# Patient Record
Sex: Female | Born: 1935 | Race: Black or African American | Hispanic: No | Smoking: Never smoker
Health system: Southern US, Community
[De-identification: ages and names within clinical notes are randomized; demographics above are authoritative.]

## PROBLEM LIST (undated history)

## (undated) DIAGNOSIS — K219 Gastro-esophageal reflux disease without esophagitis: Secondary | ICD-10-CM

## (undated) DIAGNOSIS — N189 Chronic kidney disease, unspecified: Secondary | ICD-10-CM

## (undated) DIAGNOSIS — R0602 Shortness of breath: Secondary | ICD-10-CM

## (undated) DIAGNOSIS — Z7901 Long term (current) use of anticoagulants: Secondary | ICD-10-CM

## (undated) DIAGNOSIS — I499 Cardiac arrhythmia, unspecified: Secondary | ICD-10-CM

## (undated) DIAGNOSIS — M1990 Unspecified osteoarthritis, unspecified site: Secondary | ICD-10-CM

## (undated) DIAGNOSIS — I447 Left bundle-branch block, unspecified: Secondary | ICD-10-CM

## (undated) DIAGNOSIS — J40 Bronchitis, not specified as acute or chronic: Secondary | ICD-10-CM

## (undated) DIAGNOSIS — R011 Cardiac murmur, unspecified: Secondary | ICD-10-CM

## (undated) DIAGNOSIS — Z860100 Personal history of colon polyps, unspecified: Secondary | ICD-10-CM

## (undated) DIAGNOSIS — I6521 Occlusion and stenosis of right carotid artery: Secondary | ICD-10-CM

## (undated) DIAGNOSIS — I10 Essential (primary) hypertension: Secondary | ICD-10-CM

## (undated) DIAGNOSIS — E785 Hyperlipidemia, unspecified: Secondary | ICD-10-CM

## (undated) DIAGNOSIS — R600 Localized edema: Secondary | ICD-10-CM

## (undated) DIAGNOSIS — I2510 Atherosclerotic heart disease of native coronary artery without angina pectoris: Secondary | ICD-10-CM

## (undated) HISTORY — DX: Localized edema: R60.0

## (undated) HISTORY — DX: Chronic kidney disease, unspecified: N18.9

## (undated) HISTORY — PX: ABDOMINAL HYSTERECTOMY: SHX81

## (undated) HISTORY — DX: Long term (current) use of anticoagulants: Z79.01

## (undated) HISTORY — DX: Left bundle-branch block, unspecified: I44.7

## (undated) HISTORY — PX: APPENDECTOMY: SHX54

## (undated) HISTORY — PX: CHOLECYSTECTOMY: SHX55

## (undated) HISTORY — DX: Occlusion and stenosis of right carotid artery: I65.21

## (undated) HISTORY — PX: CARPAL TUNNEL RELEASE: SHX101

---

## 2009-08-28 ENCOUNTER — Encounter: Admission: RE | Admit: 2009-08-28 | Discharge: 2009-08-28 | Payer: Self-pay | Admitting: Internal Medicine

## 2009-08-31 ENCOUNTER — Encounter: Admission: RE | Admit: 2009-08-31 | Discharge: 2009-08-31 | Payer: Self-pay | Admitting: Internal Medicine

## 2010-10-03 ENCOUNTER — Encounter
Admission: RE | Admit: 2010-10-03 | Discharge: 2010-10-03 | Payer: Self-pay | Source: Home / Self Care | Attending: Internal Medicine | Admitting: Internal Medicine

## 2011-03-10 ENCOUNTER — Other Ambulatory Visit: Payer: Self-pay | Admitting: Internal Medicine

## 2011-03-10 DIAGNOSIS — R110 Nausea: Secondary | ICD-10-CM

## 2011-03-10 DIAGNOSIS — K219 Gastro-esophageal reflux disease without esophagitis: Secondary | ICD-10-CM

## 2011-03-12 ENCOUNTER — Ambulatory Visit
Admission: RE | Admit: 2011-03-12 | Discharge: 2011-03-12 | Disposition: A | Discharge status: Home / Self Care | Payer: Medicare Other | Source: Ambulatory Visit | Attending: Internal Medicine | Admitting: Internal Medicine

## 2011-03-12 DIAGNOSIS — R110 Nausea: Secondary | ICD-10-CM

## 2011-03-12 DIAGNOSIS — K219 Gastro-esophageal reflux disease without esophagitis: Secondary | ICD-10-CM

## 2011-09-08 ENCOUNTER — Other Ambulatory Visit: Payer: Self-pay | Admitting: Internal Medicine

## 2011-09-08 DIAGNOSIS — Z1231 Encounter for screening mammogram for malignant neoplasm of breast: Secondary | ICD-10-CM

## 2011-10-09 ENCOUNTER — Ambulatory Visit
Admission: RE | Admit: 2011-10-09 | Discharge: 2011-10-09 | Disposition: A | Discharge status: Home / Self Care | Payer: Medicare Other | Source: Ambulatory Visit | Attending: Internal Medicine | Admitting: Internal Medicine

## 2011-10-09 DIAGNOSIS — Z1231 Encounter for screening mammogram for malignant neoplasm of breast: Secondary | ICD-10-CM

## 2012-09-06 ENCOUNTER — Other Ambulatory Visit: Payer: Self-pay | Admitting: Internal Medicine

## 2012-09-06 DIAGNOSIS — Z1231 Encounter for screening mammogram for malignant neoplasm of breast: Secondary | ICD-10-CM

## 2012-10-05 ENCOUNTER — Encounter (HOSPITAL_COMMUNITY): Payer: Self-pay | Admitting: Certified Registered"

## 2012-10-05 ENCOUNTER — Encounter (HOSPITAL_COMMUNITY): Payer: Self-pay | Admitting: *Deleted

## 2012-10-05 ENCOUNTER — Encounter (HOSPITAL_COMMUNITY)
Admission: RE | Disposition: A | Discharge status: Home / Self Care | Payer: Self-pay | Source: Ambulatory Visit | Attending: Cardiology

## 2012-10-05 ENCOUNTER — Ambulatory Visit (HOSPITAL_COMMUNITY)
Admission: RE | Admit: 2012-10-05 | Discharge: 2012-10-05 | Disposition: A | Discharge status: Home / Self Care | Payer: Medicare Other | Source: Ambulatory Visit | Attending: Cardiology | Admitting: Cardiology

## 2012-10-05 ENCOUNTER — Ambulatory Visit (HOSPITAL_COMMUNITY): Payer: Medicare Other | Admitting: Certified Registered"

## 2012-10-05 DIAGNOSIS — I4891 Unspecified atrial fibrillation: Secondary | ICD-10-CM | POA: Insufficient documentation

## 2012-10-05 DIAGNOSIS — I10 Essential (primary) hypertension: Secondary | ICD-10-CM | POA: Insufficient documentation

## 2012-10-05 HISTORY — DX: Hyperlipidemia, unspecified: E78.5

## 2012-10-05 HISTORY — DX: Gastro-esophageal reflux disease without esophagitis: K21.9

## 2012-10-05 HISTORY — PX: CARDIOVERSION: SHX1299

## 2012-10-05 HISTORY — DX: Atherosclerotic heart disease of native coronary artery without angina pectoris: I25.10

## 2012-10-05 HISTORY — DX: Personal history of colon polyps, unspecified: Z86.0100

## 2012-10-05 HISTORY — DX: Unspecified osteoarthritis, unspecified site: M19.90

## 2012-10-05 HISTORY — DX: Cardiac arrhythmia, unspecified: I49.9

## 2012-10-05 HISTORY — DX: Essential (primary) hypertension: I10

## 2012-10-05 HISTORY — DX: Shortness of breath: R06.02

## 2012-10-05 HISTORY — DX: Personal history of colonic polyps: Z86.010

## 2012-10-05 SURGERY — CARDIOVERSION
Anesthesia: Monitor Anesthesia Care

## 2012-10-05 MED ORDER — PROPOFOL 10 MG/ML IV BOLUS
INTRAVENOUS | Status: DC | PRN
  Administered 2012-10-05: 70 mg via INTRAVENOUS

## 2012-10-05 MED ORDER — SODIUM CHLORIDE 0.9 % IV SOLN
INTRAVENOUS | Status: DC
  Administered 2012-10-05: 500 mL via INTRAVENOUS

## 2012-10-05 MED ORDER — SODIUM CHLORIDE 0.9 % IV SOLN
INTRAVENOUS | Status: DC | PRN
  Administered 2012-10-05: 14:00:00 via INTRAVENOUS

## 2012-10-05 NOTE — Transfer of Care (Signed)
Immediate Anesthesia Transfer of Care Note  Patient: Kayla Austin  Procedure(s) Performed: Procedure(s) (LRB) with comments: CARDIOVERSION (N/A)  Patient Location: Endoscopy Unit  Anesthesia Type:MAC  Level of Consciousness: awake, alert  and oriented  Airway & Oxygen Therapy: Patient Spontanous Breathing  Post-op Assessment: Report given to PACU RN  Post vital signs: Reviewed and stable  Complications: No apparent anesthesia complications

## 2012-10-05 NOTE — CV Procedure (Signed)
Direct current cardioversion:  Indication symptomatic A. Fibrillation.  Procedure: Using 70 mg of IV Propofol for achieving deep (Moderate sedation), synchronized direct current cardioversion performed. Patient was delivered with 120 Joules of electricity X 1 with success to NSR. Patient tolerated the procedure well. No immediate complication noted.

## 2012-10-05 NOTE — Anesthesia Preprocedure Evaluation (Addendum)
Anesthesia Evaluation  Patient identified by MRN, date of birth, ID band Patient awake    Reviewed: Allergy & Precautions, H&P , NPO status , reviewed documented beta blocker date and time   Airway Mallampati: I  Neck ROM: Full    Dental  (+) Edentulous Upper and Dental Advisory Given   Pulmonary shortness of breath and with exertion,  breath sounds clear to auscultation        Cardiovascular hypertension, Pt. on medications and Pt. on home beta blockers + CAD + dysrhythmias Atrial Fibrillation Rhythm:Irregular     Neuro/Psych    GI/Hepatic GERD-  Medicated and Controlled,  Endo/Other    Renal/GU      Musculoskeletal   Abdominal   Peds  Hematology   Anesthesia Other Findings   Reproductive/Obstetrics                           Anesthesia Physical Anesthesia Plan  ASA: III  Anesthesia Plan: MAC   Post-op Pain Management:    Induction: Intravenous  Airway Management Planned: Mask  Additional Equipment:   Intra-op Plan:   Post-operative Plan:   Informed Consent:   Dental advisory given  Plan Discussed with: CRNA, Anesthesiologist and Surgeon  Anesthesia Plan Comments:         Anesthesia Quick Evaluation

## 2012-10-05 NOTE — Anesthesia Postprocedure Evaluation (Signed)
  Anesthesia Post-op Note  Patient: Kayla Austin  Procedure(s) Performed: Procedure(s) (LRB) with comments: CARDIOVERSION (N/A)  Patient Location: Endoscopy Unit  Anesthesia Type:MAC  Level of Consciousness: awake, alert  and oriented  Airway and Oxygen Therapy: Patient Spontanous Breathing  Post-op Pain: none  Post-op Assessment: Post-op Vital signs reviewed  Post-op Vital Signs: Reviewed and stable  Complications: No apparent anesthesia complications

## 2012-10-05 NOTE — H&P (Signed)
  Please see paper chart  

## 2012-10-05 NOTE — Preoperative (Signed)
Beta Blockers   Reason not to administer Beta Blockers:Not Applicable, last dose 10/05/12 at 09:30

## 2012-10-05 NOTE — Interval H&P Note (Signed)
History and Physical Interval Note:  10/05/2012 1:51 PM  Kayla Austin  has presented today for surgery, with the diagnosis of a-fib  The various methods of treatment have been discussed with the patient and family. After consideration of risks, benefits and other options for treatment, the patient has consented to  Procedure(s) (LRB) with comments: CARDIOVERSION (N/A) as a surgical intervention .  The patient's history has been reviewed, patient examined, no change in status, stable for surgery.  I have reviewed the patient's chart and labs.  Questions were answered to the patient's satisfaction.     Pamella Pert

## 2012-10-06 ENCOUNTER — Encounter (HOSPITAL_COMMUNITY): Payer: Self-pay | Admitting: Cardiology

## 2012-10-14 ENCOUNTER — Ambulatory Visit
Admission: RE | Admit: 2012-10-14 | Discharge: 2012-10-14 | Disposition: A | Discharge status: Home / Self Care | Payer: Medicare Other | Source: Ambulatory Visit | Attending: Internal Medicine | Admitting: Internal Medicine

## 2012-10-14 DIAGNOSIS — Z1231 Encounter for screening mammogram for malignant neoplasm of breast: Secondary | ICD-10-CM

## 2013-01-26 ENCOUNTER — Other Ambulatory Visit: Payer: Self-pay | Admitting: Gastroenterology

## 2013-01-26 DIAGNOSIS — R1032 Left lower quadrant pain: Secondary | ICD-10-CM

## 2013-01-27 ENCOUNTER — Ambulatory Visit
Admission: RE | Admit: 2013-01-27 | Discharge: 2013-01-27 | Disposition: A | Discharge status: Home / Self Care | Payer: Medicare Other | Source: Ambulatory Visit | Attending: Gastroenterology | Admitting: Gastroenterology

## 2013-01-27 DIAGNOSIS — R1032 Left lower quadrant pain: Secondary | ICD-10-CM

## 2013-01-27 MED ORDER — IOHEXOL 300 MG/ML  SOLN
100.0000 mL | Freq: Once | INTRAMUSCULAR | Status: AC | PRN
  Administered 2013-01-27: 100 mL via INTRAVENOUS

## 2013-05-26 ENCOUNTER — Ambulatory Visit: Payer: Self-pay | Admitting: Obstetrics

## 2013-07-26 ENCOUNTER — Ambulatory Visit (INDEPENDENT_AMBULATORY_CARE_PROVIDER_SITE_OTHER): Payer: Medicare Other | Admitting: Obstetrics

## 2013-07-26 ENCOUNTER — Encounter: Payer: Self-pay | Admitting: Obstetrics

## 2013-07-26 VITALS — BP 154/75 | HR 73 | Temp 97.7°F | Ht 63.0 in | Wt 185.0 lb

## 2013-07-26 DIAGNOSIS — L0233 Carbuncle of buttock: Secondary | ICD-10-CM

## 2013-07-26 DIAGNOSIS — L0232 Furuncle of buttock: Secondary | ICD-10-CM

## 2013-07-26 NOTE — Progress Notes (Signed)
Subjective:     Kayla Austin is a 79 y.o. female here for a routine exam.  Current complaints: Patient is in the office today for annual exam- patient states she was notified by a letter that she needed to have a gyn exam. Patient is not interested in HRT- does sweat a lot. Patient has a bump on her behind.  Personal health questionnaire reviewed: yes.   Gynecologic History No LMP recorded. Patient has had a hysterectomy. Contraception: none Last Pap: years. Results were: normal Last mammogram: 1 year. Results were: normal  Obstetric History OB History  No data available     The following portions of the patient's history were reviewed and updated as appropriate: allergies, current medications, past family history, past medical history, past social history, past surgical history and problem list.  Review of Systems Pertinent items are noted in HPI.    Objective:    General appearance: alert and no distress Breasts: normal appearance, no masses or tenderness Abdomen: normal findings: soft, non-tender Pelvic: external genitalia normal, no adnexal masses or tenderness, uterus surgically absent and vagina normal without discharge   Buttocks:  Small furuncle right inner buttocks.  Small sore at upper split between buttocks. Assessment:    Healthy female exam.    Plan:    Follow up in: 2 years.    Recommended Neosporin for sores on buttocks.

## 2013-09-28 ENCOUNTER — Other Ambulatory Visit: Payer: Self-pay

## 2013-09-28 DIAGNOSIS — Z1231 Encounter for screening mammogram for malignant neoplasm of breast: Secondary | ICD-10-CM

## 2013-10-05 ENCOUNTER — Encounter (HOSPITAL_COMMUNITY): Payer: Self-pay | Admitting: Emergency Medicine

## 2013-10-05 ENCOUNTER — Emergency Department (INDEPENDENT_AMBULATORY_CARE_PROVIDER_SITE_OTHER)
Admission: EM | Admit: 2013-10-05 | Discharge: 2013-10-05 | Disposition: A | Discharge status: Home / Self Care | Payer: Medicare Other | Source: Home / Self Care | Attending: Emergency Medicine | Admitting: Emergency Medicine

## 2013-10-05 DIAGNOSIS — L0231 Cutaneous abscess of buttock: Secondary | ICD-10-CM

## 2013-10-05 MED ORDER — MELOXICAM 7.5 MG PO TABS: 7.5000 mg | ORAL_TABLET | Freq: Every day | ORAL | Status: DC

## 2013-10-05 MED ORDER — DOXYCYCLINE HYCLATE 100 MG PO CAPS: 100.0000 mg | ORAL_CAPSULE | Freq: Two times a day (BID) | ORAL | Status: DC

## 2013-10-05 NOTE — ED Provider Notes (Signed)
CSN: [id number]     Arrival date & time 10/05/13  1210 History   First MD Initiated Contact with Patient 10/05/13 1321     Chief Complaint  Patient presents with  . Skin Problem   (Consider location/radiation/quality/duration/timing/severity/associated sxs/prior Treatment) Patient is a 79 y.o. female presenting with abscess.  Abscess Abscess location: +right buttock. Abscess quality: painful, redness and warmth   Red streaking: no   Duration:  4 days Progression:  Worsening Pain details:    Severity:  Moderate   Duration:  3 days Context: not diabetes and not immunosuppression   Associated symptoms: no fever   Risk factors: no family hx of MRSA, no hx of MRSA and no prior abscess     Past Medical History  Diagnosis Date  . Hypertension   . GERD (gastroesophageal reflux disease)   . Arthritis   . Coronary artery disease   . Hyperlipemia   . Shortness of breath     on exertion  . Dysrhythmia     a fib  . History of colon polyps    Past Surgical History  Procedure Laterality Date  . Abdominal hysterectomy    . Cholecystectomy    . Cardioversion  10/05/2012    Procedure: CARDIOVERSION;  Surgeon: Pamella Pert, MD;  Location: [facility] ENDOSCOPY;  Service: Cardiovascular;  Laterality: N/A;   Family History  Problem Relation Age of Onset  . Cancer Sister    History  Substance Use Topics  . Smoking status: Never Smoker   . Smokeless tobacco: Not on file  . Alcohol Use: Yes     Comment: occasional   OB History   Grav Para Term Preterm Abortions TAB SAB Ect Mult Living                 Review of Systems  Constitutional: Negative for fever.  All other systems reviewed and are negative.    Allergies  Ampicillin  Home Medications   Current Outpatient Rx  Name  Route  Sig  Dispense  Refill  . amLODipine (NORVASC) 10 MG tablet   Oral   Take 10 mg by mouth daily.         Marland Kitchen apixaban (ELIQUIS) 5 MG TABS tablet   Oral   Take 5 mg by mouth 2 (two) times  daily.         Marland Kitchen doxycycline (VIBRAMYCIN) 100 MG capsule   Oral   Take 1 capsule (100 mg total) by mouth 2 (two) times daily. X 7 days   14 capsule   0   . hydrochlorothiazide (HYDRODIURIL) 25 MG tablet   Oral   Take 25 mg by mouth daily.         . meloxicam (MOBIC) 7.5 MG tablet   Oral   Take 7.5 mg by mouth daily.         . meloxicam (MOBIC) 7.5 MG tablet   Oral   Take 1 tablet (7.5 mg total) by mouth daily. As needed for shoulder pain   15 tablet   0   . metoprolol tartrate (LOPRESSOR) 25 MG tablet   Oral   Take 25 mg by mouth 2 (two) times daily.         . pravastatin (PRAVACHOL) 40 MG tablet   Oral   Take 40 mg by mouth daily.          BP 150/85  Pulse 73  Temp(Src) 97.3 F (36.3 C) (Oral)  Resp 18  SpO2 100% Physical Exam  Nursing note and vitals reviewed. Constitutional: She is oriented to person, place, and time. She appears well-developed and well-nourished. No distress.  +obese   HENT:  Head: Normocephalic and atraumatic.  Cardiovascular: Normal rate and regular rhythm.   Pulmonary/Chest: Effort normal and breath sounds normal.  Neurological: She is alert and oriented to person, place, and time.  Skin: Skin is warm and dry.  +3x3cm fluctuant abscess at right mediolateral buttock with moderate surrounding cellulitis  Psychiatric: She has a normal mood and affect. Her behavior is normal.    ED Course  INCISION AND DRAINAGE Date/Time: 10/06/2013 9:32 AM Performed by: Lemmie Evens LEE Authorized by: Lemmie Evens LEE Consent: Verbal consent obtained. Risks and benefits: risks, benefits and alternatives were discussed Consent given by: patient Patient understanding: patient states understanding of the procedure being performed Patient consent: the patient's understanding of the procedure matches consent given Procedure consent: procedure consent matches procedure scheduled Patient identity confirmed: verbally with patient Time  out: Immediately prior to procedure a "time out" was called to verify the correct patient, procedure, equipment, support staff and site/side marked as required. Type: abscess Location: +right buttock. Anesthesia: local infiltration Local anesthetic: lidocaine 2% without epinephrine Anesthetic total: 4 ml Patient sedated: no Scalpel size: 11 Incision type: single straight Complexity: simple Drainage: purulent Drainage amount: moderate Wound treatment: wound left open Patient tolerance: Patient tolerated the procedure well with no immediate complications.   (including critical care time) Labs Review Labs Reviewed - No data to display Imaging Review No results found.  EKG Interpretation    Date/Time:    Ventricular Rate:    PR Interval:    QRS Duration:   QT Interval:    QTC Calculation:   R Axis:     Text Interpretation:              MDM   1. Abscess of buttock, right    Reviewed wound care/aftercare instructions with patient prior to discharge. Advised to return for follow up if symptoms do not improve.    Jess Barters [place], [place] 10/06/13 [phone]

## 2013-10-05 NOTE — ED Notes (Signed)
C/o 2 sore places on her skin,  (buttocks, shoulder) since Sunday

## 2013-10-06 NOTE — ED Provider Notes (Signed)
Medical screening examination/treatment/procedure(s) were performed by non-physician practitioner and as supervising physician I was immediately available for consultation/collaboration.  Alyzae Hawkey, M.D.  Torryn Hudspeth C Osama Coleson, MD 10/06/13 1003 

## 2013-11-07 ENCOUNTER — Ambulatory Visit
Admission: RE | Admit: 2013-11-07 | Discharge: 2013-11-07 | Disposition: A | Discharge status: Home / Self Care | Payer: Medicare Other | Source: Ambulatory Visit

## 2013-11-07 DIAGNOSIS — Z1231 Encounter for screening mammogram for malignant neoplasm of breast: Secondary | ICD-10-CM

## 2014-05-16 ENCOUNTER — Encounter (HOSPITAL_COMMUNITY): Payer: Self-pay | Admitting: Emergency Medicine

## 2014-05-16 ENCOUNTER — Emergency Department (INDEPENDENT_AMBULATORY_CARE_PROVIDER_SITE_OTHER)
Admission: EM | Admit: 2014-05-16 | Discharge: 2014-05-16 | Disposition: A | Discharge status: Home / Self Care | Payer: Medicare Other | Source: Home / Self Care | Attending: Family Medicine | Admitting: Family Medicine

## 2014-05-16 ENCOUNTER — Other Ambulatory Visit (HOSPITAL_COMMUNITY)
Admission: RE | Admit: 2014-05-16 | Discharge: 2014-05-16 | Disposition: A | Discharge status: Home / Self Care | Payer: Medicare Other | Source: Ambulatory Visit | Attending: Family Medicine | Admitting: Family Medicine

## 2014-05-16 DIAGNOSIS — Z113 Encounter for screening for infections with a predominantly sexual mode of transmission: Secondary | ICD-10-CM | POA: Insufficient documentation

## 2014-05-16 DIAGNOSIS — N926 Irregular menstruation, unspecified: Secondary | ICD-10-CM

## 2014-05-16 DIAGNOSIS — N760 Acute vaginitis: Secondary | ICD-10-CM | POA: Insufficient documentation

## 2014-05-16 DIAGNOSIS — N939 Abnormal uterine and vaginal bleeding, unspecified: Secondary | ICD-10-CM

## 2014-05-16 LAB — POCT URINALYSIS DIP (DEVICE)
BILIRUBIN URINE: NEGATIVE
GLUCOSE, UA: NEGATIVE mg/dL
KETONES UR: NEGATIVE mg/dL
Nitrite: NEGATIVE
Protein, ur: NEGATIVE mg/dL
SPECIFIC GRAVITY, URINE: 1.015 (ref 1.005–1.030)
Urobilinogen, UA: 0.2 mg/dL (ref 0.0–1.0)
pH: 7 (ref 5.0–8.0)

## 2014-05-16 MED ORDER — METRONIDAZOLE 500 MG PO TABS: 500.0000 mg | ORAL_TABLET | Freq: Two times a day (BID) | ORAL | Status: DC

## 2014-05-16 MED ORDER — CIPROFLOXACIN HCL 500 MG PO TABS: 500.0000 mg | ORAL_TABLET | Freq: Two times a day (BID) | ORAL | Status: DC

## 2014-05-16 NOTE — ED Provider Notes (Signed)
CSN: [id number]     Arrival date & time 05/16/14  1223 History   First MD Initiated Contact with Patient 05/16/14 1346     Chief Complaint  Patient presents with  . Vaginal Bleeding   (Consider location/radiation/quality/duration/timing/severity/associated sxs/prior Treatment) HPI Comments: 80 year old female presents complaining of having vaginal bleeding a few days ago that has slowed down, and now she is having what she describes as "poop from my vagina." She feels like this is getting worse. She notices a brown, fecal smelling residue after she wipes after urinating. She had an appointment with her gynecologist for 2 days from now but she decided to come here instead. No abdominal pain, dysuria, weight loss, NVD. She has never had this happen before. She is not sexually active.  Patient is a 80 y.o. female presenting with vaginal bleeding.  Vaginal Bleeding Associated symptoms: vaginal discharge     Past Medical History  Diagnosis Date  . Hypertension   . GERD (gastroesophageal reflux disease)   . Arthritis   . Coronary artery disease   . Hyperlipemia   . Shortness of breath     on exertion  . Dysrhythmia     a fib  . History of colon polyps    Past Surgical History  Procedure Laterality Date  . Abdominal hysterectomy    . Cholecystectomy    . Cardioversion  10/05/2012    Procedure: CARDIOVERSION;  Surgeon: Pamella Pert, MD;  Location: [facility] ENDOSCOPY;  Service: Cardiovascular;  Laterality: N/A;   Family History  Problem Relation Age of Onset  . Cancer Sister    History  Substance Use Topics  . Smoking status: Never Smoker   . Smokeless tobacco: Not on file  . Alcohol Use: Yes     Comment: occasional   OB History   Grav Para Term Preterm Abortions TAB SAB Ect Mult Living                 Review of Systems  Genitourinary: Positive for vaginal bleeding and vaginal discharge.  All other systems reviewed and are negative.   Allergies  Ampicillin  Home  Medications   Prior to Admission medications   Medication Sig Start Date End Date Taking? Authorizing Provider  apixaban (ELIQUIS) 5 MG TABS tablet Take 5 mg by mouth 2 (two) times daily.   Yes Historical Provider, MD  atorvastatin (LIPITOR) 10 MG tablet Take 10 mg by mouth daily.   Yes Historical Provider, MD  hydrochlorothiazide (HYDRODIURIL) 25 MG tablet Take 25 mg by mouth daily.   Yes Historical Provider, MD  losartan (COZAAR) 25 MG tablet Take 25 mg by mouth daily.   Yes Historical Provider, MD  metoprolol tartrate (LOPRESSOR) 25 MG tablet Take 50 mg by mouth 2 (two) times daily.    Yes Historical Provider, MD  ciprofloxacin (CIPRO) 500 MG tablet Take 1 tablet (500 mg total) by mouth every 12 (twelve) hours. 05/16/14   Graylon Good, PA-C  meloxicam (MOBIC) 7.5 MG tablet Take 7.5 mg by mouth daily.    Historical Provider, MD  meloxicam (MOBIC) 7.5 MG tablet Take 1 tablet (7.5 mg total) by mouth daily. As needed for shoulder pain 10/05/13   Jess Barters [place], [place]  metroNIDAZOLE (FLAGYL) 500 MG tablet Take 1 tablet (500 mg total) by mouth 2 (two) times daily. 05/16/14   Adrian Blackwater Lynnleigh Soden, PA-C   BP 174/81  Pulse 68  Temp(Src) 97.6 F (36.4 C) (Oral)  Resp 16  SpO2 100% Physical Exam  Nursing note and vitals reviewed. Constitutional: She is oriented to person, place, and time. Vital signs are normal. She appears well-developed and well-nourished. No distress.  HENT:  Head: Normocephalic and atraumatic.  Pulmonary/Chest: Effort normal. No respiratory distress.  Abdominal: Normal appearance and bowel sounds are normal. She exhibits no mass. There is no tenderness.  Genitourinary: There is no tenderness or lesion on the right labia. There is no tenderness or lesion on the left labia. Cervix exhibits friability. Cervix exhibits no motion tenderness and no discharge. There is erythema around the vagina. Vaginal discharge (green discharge, malodorous) found.  Lymphadenopathy:       Right:  No inguinal adenopathy present.       Left: No inguinal adenopathy present.  Neurological: She is alert and oriented to person, place, and time. She has normal strength. Coordination normal.  Skin: Skin is warm and dry. No rash noted. She is not diaphoretic.  Psychiatric: She has a normal mood and affect. Judgment normal.    ED Course  Procedures (including critical care time) Labs Review Labs Reviewed  POCT URINALYSIS DIP (DEVICE) - Abnormal; Notable for the following:    Hgb urine dipstick SMALL (*)    Leukocytes, UA LARGE (*)    All other components within normal limits  URINE CULTURE  CERVICOVAGINAL ANCILLARY ONLY    Imaging Review No results found.   MDM   1. Vaginitis   2. Abnormal uterine bleeding (AUB)    Discussed with the patient her need to followup with GYN for abnormal uterine bleeding, she needs endometrial biopsy to rule out uterine cancer. Treating with Cipro and Flagyl for now for possible UTI and vaginitis. I have expressed to her that even if she gets better, she still has a followup with GYN on Thursday. She understands  New Prescriptions   CIPROFLOXACIN (CIPRO) 500 MG TABLET    Take 1 tablet (500 mg total) by mouth every 12 (twelve) hours.   METRONIDAZOLE (FLAGYL) 500 MG TABLET    Take 1 tablet (500 mg total) by mouth 2 (two) times daily.       Graylon GoodZachary H Geordan Xu, PA-C 05/16/14 1431

## 2014-05-16 NOTE — ED Notes (Signed)
Patient reports she has been having abnormal discharge after urinating. She reports at first when she wiped it was a pink color but now its brownish discharge. Patient reports she was diagnosed with stage 3 ckd recently and is not sure if it is coming from that. Patient is alert and oriented and in no acute distress.

## 2014-05-16 NOTE — Discharge Instructions (Signed)
Regardless of whether he started to feel better, you need to see your gynecologist on Thursday as scheduled.  Abnormal Uterine Bleeding Abnormal uterine bleeding can affect women at various stages in life, including teenagers, women in their reproductive years, pregnant women, and women who have reached menopause. Several kinds of uterine bleeding are considered abnormal, including:  Bleeding or spotting between periods.   Bleeding after sexual intercourse.   Bleeding that is heavier or more than normal.   Periods that last longer than usual.  Bleeding after menopause.  Many cases of abnormal uterine bleeding are minor and simple to treat, while others are more serious. Any type of abnormal bleeding should be evaluated by your health care provider. Treatment will depend on the cause of the bleeding. HOME CARE INSTRUCTIONS Monitor your condition for any changes. The following actions may help to alleviate any discomfort you are experiencing:  Avoid the use of tampons and douches as directed by your health care provider.  Change your pads frequently. You should get regular pelvic exams and Pap tests. Keep all follow-up appointments for diagnostic tests as directed by your health care provider.  SEEK MEDICAL CARE IF:   Your bleeding lasts more than 1 week.   You feel dizzy at times.  SEEK IMMEDIATE MEDICAL CARE IF:   You pass out.   You are changing pads every 15 to 30 minutes.   You have abdominal pain.  You have a fever.   You become sweaty or weak.   You are passing large blood clots from the vagina.   You start to feel nauseous and vomit. MAKE SURE YOU:   Understand these instructions.  Will watch your condition.  Will get help right away if you are not doing well or get worse. Document Released: 10/13/2005 Document Revised: 10/18/2013 Document Reviewed: 05/12/2013 [facility] Patient Information 2015 [employer], [place]. This information is not intended to  replace advice given to you by your health care provider. Make sure you discuss any questions you have with your health care provider.

## 2014-05-16 NOTE — ED Provider Notes (Signed)
Medical screening examination/treatment/procedure(s) were performed by resident physician or non-physician practitioner and as supervising physician I was immediately available for consultation/collaboration.   KINDL,JAMES DOUGLAS MD.   James D Kindl, MD 05/16/14 1710 

## 2014-05-17 NOTE — ED Notes (Signed)
Patient called, discussed questions w provider

## 2014-05-18 ENCOUNTER — Encounter: Payer: Self-pay | Admitting: Obstetrics

## 2014-05-18 ENCOUNTER — Ambulatory Visit (INDEPENDENT_AMBULATORY_CARE_PROVIDER_SITE_OTHER): Payer: Medicare Other | Admitting: Obstetrics

## 2014-05-18 VITALS — BP 180/77 | HR 114 | Temp 98.5°F | Wt 189.0 lb

## 2014-05-18 DIAGNOSIS — N390 Urinary tract infection, site not specified: Secondary | ICD-10-CM

## 2014-05-18 LAB — URINE CULTURE

## 2014-05-18 NOTE — ED Notes (Signed)
GC/Chlamydia/Affirm all neg., Urine culture: 20,000 colonies E. Coli.  Pt. adequately treated with Cipro. Kayla Austin, Kayla Austin 05/18/2014

## 2014-05-19 LAB — WET PREP BY MOLECULAR PROBE
Candida species: NEGATIVE
Gardnerella vaginalis: NEGATIVE
TRICHOMONAS VAG: NEGATIVE

## 2014-05-19 NOTE — Progress Notes (Signed)
Patient ID: Kayla Austin, female   DOB: 10/18/1932, 80 y.o.   MRN: [id number]  Chief Complaint  Patient presents with  . Gynecologic Exam    HPI Kayla Austin is a 80 y.o. female.  C/O vaginal bleeding.  She has had a hysterectomy. HPI  Past Medical History  Diagnosis Date  . Hypertension   . GERD (gastroesophageal reflux disease)   . Arthritis   . Coronary artery disease   . Hyperlipemia   . Shortness of breath     on exertion  . Dysrhythmia     a fib  . History of colon polyps   . Chronic kidney disease     Past Surgical History  Procedure Laterality Date  . Abdominal hysterectomy    . Cholecystectomy    . Cardioversion  10/05/2012    Procedure: CARDIOVERSION;  Surgeon: Pamella PertJagadeesh R Ganji, MD;  Location: [facility] ENDOSCOPY;  Service: Cardiovascular;  Laterality: N/A;    Family History  Problem Relation Age of Onset  . Cancer Sister     Social History History  Substance Use Topics  . Smoking status: Never Smoker   . Smokeless tobacco: Not on file  . Alcohol Use: Yes     Comment: occasional    Allergies  Allergen Reactions  . Ampicillin Rash    Current Outpatient Prescriptions  Medication Sig Dispense Refill  . apixaban (ELIQUIS) 5 MG TABS tablet Take 5 mg by mouth 2 (two) times daily.      Marland Kitchen. atorvastatin (LIPITOR) 10 MG tablet Take 10 mg by mouth daily.      . ciprofloxacin (CIPRO) 500 MG tablet Take 1 tablet (500 mg total) by mouth every 12 (twelve) hours.  14 tablet  0  . hydrochlorothiazide (HYDRODIURIL) 25 MG tablet Take 25 mg by mouth daily.      Marland Kitchen. losartan (COZAAR) 25 MG tablet Take 25 mg by mouth daily.      . meloxicam (MOBIC) 7.5 MG tablet Take 1 tablet (7.5 mg total) by mouth daily. As needed for shoulder pain  15 tablet  0  . metoprolol tartrate (LOPRESSOR) 25 MG tablet Take 50 mg by mouth 2 (two) times daily.       . metroNIDAZOLE (FLAGYL) 500 MG tablet Take 1 tablet (500 mg total) by mouth 2 (two) times daily.  14 tablet  0  .  meloxicam (MOBIC) 7.5 MG tablet Take 7.5 mg by mouth daily.       No current facility-administered medications for this visit.    Review of Systems Review of Systems Constitutional: negative for fatigue and weight loss Respiratory: negative for cough and wheezing Cardiovascular: negative for chest pain, fatigue and palpitations Gastrointestinal: negative for abdominal pain and change in bowel habits Genitourinary:  Vaginal bleeding Integument/breast: negative for nipple discharge Musculoskeletal:negative for myalgias Neurological: negative for gait problems and tremors Behavioral/Psych: negative for abusive relationship, depression Endocrine: negative for temperature intolerance     Blood pressure 180/77, pulse 114, temperature 98.5 F (36.9 C), weight 189 lb (85.73 kg).  Physical Exam Physical Exam General:   alert  Skin:   no rash or abnormalities  Lungs:   clear to auscultation bilaterally  Heart:   regular rate and rhythm, S1, S2 normal, no murmur, click, rub or gallop  Breasts:   normal without suspicious masses, skin or nipple changes or axillary nodes  Abdomen:  normal findings: no organomegaly, soft, non-tender and no hernia  Pelvis:  External genitalia: normal general appearance Urinary system: urethral meatus  normal and bladder without fullness, nontender Vaginal: normal without tenderness, induration or masses Cervix: absent Adnexa: not felt Uterus:  absent      Data Reviewed Labs  Assessment    UTI     Plan    Ciprofloxacin Rx F/U prn   Orders Placed This Encounter  Procedures  . WET PREP BY MOLECULAR PROBE   No orders of the defined types were placed in this encounter.       Alonnie Bieker A 05/19/2014, 5:56 AM

## 2014-05-24 ENCOUNTER — Other Ambulatory Visit: Payer: Self-pay | Admitting: Gastroenterology

## 2014-05-24 DIAGNOSIS — K921 Melena: Secondary | ICD-10-CM

## 2014-05-31 ENCOUNTER — Ambulatory Visit
Admission: RE | Admit: 2014-05-31 | Discharge: 2014-05-31 | Disposition: A | Discharge status: Home / Self Care | Payer: Medicare Other | Source: Ambulatory Visit | Attending: Gastroenterology | Admitting: Gastroenterology

## 2014-05-31 DIAGNOSIS — K921 Melena: Secondary | ICD-10-CM

## 2014-05-31 MED ORDER — IOHEXOL 300 MG/ML  SOLN
100.0000 mL | Freq: Once | INTRAMUSCULAR | Status: AC | PRN
  Administered 2014-05-31: 100 mL via INTRAVENOUS

## 2014-06-02 ENCOUNTER — Other Ambulatory Visit: Payer: Self-pay | Admitting: Gastroenterology

## 2014-06-06 ENCOUNTER — Encounter (HOSPITAL_COMMUNITY): Payer: Self-pay | Admitting: Pharmacy Technician

## 2014-06-09 ENCOUNTER — Ambulatory Visit (HOSPITAL_COMMUNITY)
Admission: RE | Admit: 2014-06-09 | Discharge: 2014-06-09 | Disposition: A | Discharge status: Home / Self Care | Payer: Medicare Other | Source: Ambulatory Visit | Attending: Gastroenterology | Admitting: Gastroenterology

## 2014-06-09 ENCOUNTER — Encounter (HOSPITAL_COMMUNITY): Payer: Self-pay

## 2014-06-09 ENCOUNTER — Encounter (HOSPITAL_COMMUNITY)
Admission: RE | Disposition: A | Discharge status: Home / Self Care | Payer: Self-pay | Source: Ambulatory Visit | Attending: Gastroenterology

## 2014-06-09 DIAGNOSIS — N189 Chronic kidney disease, unspecified: Secondary | ICD-10-CM | POA: Diagnosis not present

## 2014-06-09 DIAGNOSIS — Z881 Allergy status to other antibiotic agents status: Secondary | ICD-10-CM | POA: Insufficient documentation

## 2014-06-09 DIAGNOSIS — Z79899 Other long term (current) drug therapy: Secondary | ICD-10-CM | POA: Diagnosis not present

## 2014-06-09 DIAGNOSIS — K625 Hemorrhage of anus and rectum: Secondary | ICD-10-CM | POA: Insufficient documentation

## 2014-06-09 DIAGNOSIS — K5730 Diverticulosis of large intestine without perforation or abscess without bleeding: Secondary | ICD-10-CM | POA: Insufficient documentation

## 2014-06-09 DIAGNOSIS — Z90710 Acquired absence of both cervix and uterus: Secondary | ICD-10-CM | POA: Insufficient documentation

## 2014-06-09 DIAGNOSIS — E785 Hyperlipidemia, unspecified: Secondary | ICD-10-CM | POA: Insufficient documentation

## 2014-06-09 DIAGNOSIS — R9389 Abnormal findings on diagnostic imaging of other specified body structures: Secondary | ICD-10-CM | POA: Diagnosis present

## 2014-06-09 DIAGNOSIS — I129 Hypertensive chronic kidney disease with stage 1 through stage 4 chronic kidney disease, or unspecified chronic kidney disease: Secondary | ICD-10-CM | POA: Diagnosis not present

## 2014-06-09 HISTORY — PX: FLEXIBLE SIGMOIDOSCOPY: SHX5431

## 2014-06-09 SURGERY — SIGMOIDOSCOPY, FLEXIBLE
Anesthesia: Moderate Sedation

## 2014-06-09 MED ORDER — SPOT INK MARKER SYRINGE KIT
PACK | SUBMUCOSAL | Status: AC
  Filled 2014-06-09: qty 5

## 2014-06-09 MED ORDER — SPOT INK MARKER SYRINGE KIT
PACK | SUBMUCOSAL | Status: DC | PRN
  Administered 2014-06-09: 4 mL via SUBMUCOSAL

## 2014-06-09 MED ORDER — SODIUM CHLORIDE 0.9 % IV SOLN: INTRAVENOUS | Status: DC

## 2014-06-09 NOTE — Interval H&P Note (Signed)
History and Physical Interval Note:  06/09/2014 11:21 AM  Kayla Austin  has presented today for surgery, with the diagnosis of rectal bleeding/evaluate for rectovaginal fistula  The various methods of treatment have been discussed with the patient and family. After consideration of risks, benefits and other options for treatment, the patient has consented to  Procedure(s): FLEXIBLE SIGMOIDOSCOPY (N/A) as a surgical intervention .  The patient's history has been reviewed, patient examined, no change in status, stable for surgery.  I have reviewed the patient's chart and labs.  Questions were answered to the patient's satisfaction.     [name]

## 2014-06-09 NOTE — Op Note (Signed)
[facility] [address] [place], [id number]   FLEXIBLE SIGMOIDOSCOPY PROCEDURE REPORT  PATIENT: Kayla Austin, Kayla B.  MR#: [id number] BIRTHDATE: April 01, 1932 , 81  yrs. old GENDER: Female ENDOSCOPIST: Jeani HawkingPatrick Cindy Brindisi, MD REFERRED BY: PROCEDURE DATE:  06/09/2014 PROCEDURE:   Sigmoidoscopy, diagnostic ASA CLASS:   Class III INDICATIONS:Abnormal CT scan. MEDICATIONS: None  DESCRIPTION OF PROCEDURE:   After the risks benefits and alternatives of the procedure were thoroughly explained, informed consent was obtained.  revealed no abnormalities of the rectum. The endoscope was introduced through the anus  and advanced to the sigmoid colon , limited by No adverse events experienced.   The quality of the prep was excellent .  The instrument was then slowly withdrawn as the mucosa was fully examined.        FINDINGS: No overt rectovaginal fistulat or colovaginal fistula identified.  SPOT was sprayed and 30 minutes after the procedure the patient's tampon was removed.  No evidence of SPOT on the tampon..    Retroflexed views revealed no abnormalities.    The scope was then withdrawn from the patient and the procedure terminated.  COMPLICATIONS: There were no complications.  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 2) Sigmoid diverticula.  RECOMMENDATIONS: 1) The patient is to call me if she has any evidence of SPOT from her vagina. 2) Surgical consultation.  REPEAT EXAM:   _______________________________ eSignedJeani Hawking:  Terresa Marlett, MD 06/09/2014 2:12 PM   CC:

## 2014-06-09 NOTE — H&P (View-Only) (Signed)
Patient ID: Geraldo DockerLessie Arlean Kump, female   DOB: 10/18/1932, 80 y.o.   MRN: [id number]  Chief Complaint  Patient presents with  . Gynecologic Exam    HPI Geraldo DockerLessie Arlean Hegler is a 80 y.o. female.  C/O vaginal bleeding.  She has had a hysterectomy. HPI  Past Medical History  Diagnosis Date  . Hypertension   . GERD (gastroesophageal reflux disease)   . Arthritis   . Coronary artery disease   . Hyperlipemia   . Shortness of breath     on exertion  . Dysrhythmia     a fib  . History of colon polyps   . Chronic kidney disease     Past Surgical History  Procedure Laterality Date  . Abdominal hysterectomy    . Cholecystectomy    . Cardioversion  10/05/2012    Procedure: CARDIOVERSION;  Surgeon: Pamella PertJagadeesh R Ganji, MD;  Location: [facility];  Service: Cardiovascular;  Laterality: N/A;    Family History  Problem Relation Age of Onset  . Cancer Sister     Social History History  Substance Use Topics  . Smoking status: Never Smoker   . Smokeless tobacco: Not on file  . Alcohol Use: Yes     Comment: occasional    Allergies  Allergen Reactions  . Ampicillin Rash    Current Outpatient Prescriptions  Medication Sig Dispense Refill  . apixaban (ELIQUIS) 5 MG TABS tablet Take 5 mg by mouth 2 (two) times daily.      Marland Kitchen. atorvastatin (LIPITOR) 10 MG tablet Take 10 mg by mouth daily.      . ciprofloxacin (CIPRO) 500 MG tablet Take 1 tablet (500 mg total) by mouth every 12 (twelve) hours.  14 tablet  0  . hydrochlorothiazide (HYDRODIURIL) 25 MG tablet Take 25 mg by mouth daily.      Marland Kitchen. losartan (COZAAR) 25 MG tablet Take 25 mg by mouth daily.      . meloxicam (MOBIC) 7.5 MG tablet Take 1 tablet (7.5 mg total) by mouth daily. As needed for shoulder pain  15 tablet  0  . metoprolol tartrate (LOPRESSOR) 25 MG tablet Take 50 mg by mouth 2 (two) times daily.       . metroNIDAZOLE (FLAGYL) 500 MG tablet Take 1 tablet (500 mg total) by mouth 2 (two) times daily.  14 tablet  0  .  meloxicam (MOBIC) 7.5 MG tablet Take 7.5 mg by mouth daily.       No current facility-administered medications for this visit.    Review of Systems Review of Systems Constitutional: negative for fatigue and weight loss Respiratory: negative for cough and wheezing Cardiovascular: negative for chest pain, fatigue and palpitations Gastrointestinal: negative for abdominal pain and change in bowel habits Genitourinary:  Vaginal bleeding Integument/breast: negative for nipple discharge Musculoskeletal:negative for myalgias Neurological: negative for gait problems and tremors Behavioral/Psych: negative for abusive relationship, depression Endocrine: negative for temperature intolerance     Blood pressure 180/77, pulse 114, temperature 98.5 F (36.9 C), weight 189 lb (85.73 kg).  Physical Exam Physical Exam General:   alert  Skin:   no rash or abnormalities  Lungs:   clear to auscultation bilaterally  Heart:   regular rate and rhythm, S1, S2 normal, no murmur, click, rub or gallop  Breasts:   normal without suspicious masses, skin or nipple changes or axillary nodes  Abdomen:  normal findings: no organomegaly, soft, non-tender and no hernia  Pelvis:  External genitalia: normal general appearance Urinary system: urethral meatus  normal and bladder without fullness, nontender Vaginal: normal without tenderness, induration or masses Cervix: absent Adnexa: not felt Uterus:  absent      Data Reviewed Labs  Assessment    UTI     Plan    Ciprofloxacin Rx F/U prn   Orders Placed This Encounter  Procedures  . WET PREP BY MOLECULAR PROBE   No orders of the defined types were placed in this encounter.       HARPER,CHARLES A 05/19/2014, 5:56 AM

## 2014-06-12 ENCOUNTER — Encounter (HOSPITAL_COMMUNITY): Payer: Self-pay | Admitting: Gastroenterology

## 2014-06-12 ENCOUNTER — Other Ambulatory Visit: Payer: Self-pay | Admitting: Gastroenterology

## 2014-06-12 DIAGNOSIS — K625 Hemorrhage of anus and rectum: Secondary | ICD-10-CM

## 2014-06-19 ENCOUNTER — Other Ambulatory Visit: Payer: Self-pay | Admitting: Gastroenterology

## 2014-06-19 ENCOUNTER — Ambulatory Visit
Admission: RE | Admit: 2014-06-19 | Discharge: 2014-06-19 | Disposition: A | Discharge status: Home / Self Care | Payer: Medicare Other | Source: Ambulatory Visit | Attending: Gastroenterology | Admitting: Gastroenterology

## 2014-06-19 DIAGNOSIS — K625 Hemorrhage of anus and rectum: Secondary | ICD-10-CM

## 2014-07-12 ENCOUNTER — Ambulatory Visit (INDEPENDENT_AMBULATORY_CARE_PROVIDER_SITE_OTHER): Payer: Medicare Other | Admitting: General Surgery

## 2014-08-03 ENCOUNTER — Encounter (HOSPITAL_COMMUNITY): Payer: Self-pay | Admitting: Pharmacy Technician

## 2014-08-03 ENCOUNTER — Other Ambulatory Visit (INDEPENDENT_AMBULATORY_CARE_PROVIDER_SITE_OTHER): Payer: Self-pay | Admitting: General Surgery

## 2014-08-03 NOTE — Progress Notes (Signed)
Please put orders in Epic surgery 08-18-14 pre op 08-10-14 Thanks

## 2014-08-03 NOTE — H&P (Signed)
Kayla Austin 07/12/2014 10:34 AM Location: [facility] Patient #: [id number] DOB: 04/22/1932 Separated / Language: English / Race: Black or African American Female  History of Present Illness (Hiram Mciver MD; 07/12/2014 11:42 AM) Patient words: Referred for possible fistula.  The patient is a 80 year old female who presents with a complaint of colovaginal fistula. Patient presents to the office with complaint of stool per vagina. She has been evaluated by Dr. Hung for a colovaginal fistula. She is underwent a barium enema which showed a distal sigmoid to vaginal apex fistula. Her last full colonoscopy showed 2 polyps in the transverse colon. She is due for another full colonoscopy this year. She did undergo a flexible sigmoidoscopy which showed no signs of tumor and significant diverticulosis. Of note the patient has stage III hypertensive kidney disease. She also has a history of atrial fibrillation requiring cardioversion.   Other Problems (Rachel McMillen; 07/12/2014 10:34 AM) Gastroesophageal Reflux Disease Heart murmur Hemorrhoids High blood pressure Hypercholesterolemia  Past Surgical History (Rachel McMillen; 07/12/2014 10:34 AM) Hysterectomy (not due to cancer) - Partial  Diagnostic Studies History (Rachel McMillen; 07/12/2014 10:34 AM) Mammogram within last year  Allergies (Rachel McMillen; 07/12/2014 10:35 AM) Ampicillin *PENICILLINS*  Medication History (Rachel McMillen; 07/12/2014 10:36 AM) Eliquis (5MG Tablet, Oral dtw) Active. Metoprolol Tartrate (50MG Tablet, Oral two times daily) Active. Atorvastatin Calcium (10MG Tablet, Oral daily) Active. Hydrochlorothiazide (25MG Tablet, Oral daily) Active. Losartan Potassium (25MG Tablet, Oral daily) Active.  Review of Systems (Rachel McMillen; 07/12/2014 10:34 AM) General Present- Night Sweats. Not Present- Appetite Loss, Chills, Fatigue, Fever, Weight Gain and Weight Loss. HEENT Present- Seasonal  Allergies. Not Present- Earache, Hearing Loss, Hoarseness, Nose Bleed, Oral Ulcers, Ringing in the Ears, Sinus Pain, Sore Throat, Visual Disturbances, Wears glasses/contact lenses and Yellow Eyes. Cardiovascular Present- Leg Cramps. Not Present- Chest Pain, Difficulty Breathing Lying Down, Palpitations, Rapid Heart Rate, Shortness of Breath and Swelling of Extremities. Gastrointestinal Present- Bloating, Hemorrhoids and Nausea. Not Present- Abdominal Pain, Bloody Stool, Change in Bowel Habits, Chronic diarrhea, Constipation, Difficulty Swallowing, Excessive gas, Gets full quickly at meals, Indigestion, Rectal Pain and Vomiting.   Vitals (Rachel McMillen; 07/12/2014 10:35 AM) 07/12/2014 10:34 AM Weight: 177.5 lb Height: 63in Body Surface Area: 1.89 m Body Mass Index: 31.44 kg/m Temp.: 97.7F(Oral)  Pulse: 76 (Irregular)  Resp.: 18 (Unlabored)  BP: 138/84 (Sitting, Left Arm, Standard)    Physical Exam (Daris Aristizabal MD; 07/12/2014 11:24 AM) General Mental Status-Alert. General Appearance-Cooperative.  Chest and Lung Exam Chest and lung exam reveals -on auscultation, normal breath sounds, no adventitious sounds and normal vocal resonance.  Cardiovascular Auscultation Rhythm - Regular. Heart Sounds - Normal heart sounds.  Rectal Anorectal Exam External - normal external exam. Internal - normal internal exam and normal sphincter tone.    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 Possible sigmoidoscopy does not show any signs of a tumor or colon mass causing the fistula. I did discuss her renal function with Dr. Sadiq, her nephrologist. she explained to me that her creatinine was normal and her GFR was 73. She stated that her ultrasound showed a most likely benign cyst but could not rule out a neoplastic component. She is scheduled for a followup ultrasound in 6 months. She  did not feel that there was any other workup needed for this at the moment. Impression: I believe she will need a partial colon resection to improve her symptoms.I will   also send a cardiac clearance evaluation to Dr. Jacinto HalimGanji. once this information has been received, we will bring her back in to the office for further discussion of surgery and the risk entailed to perform the resection. The surgery and anatomy were described to the patient as well as the risks of surgery and the possible complications. These include: Bleeding, deep abdominal infections and possible wound complications such as hernia and infection, damage to adjacent structures, leak of surgical connections, which can lead to other surgeries and possibly an ostomy, possible need for other procedures, such as abscess drains in radiology, possible prolonged hospital stay, possible diarrhea from removal of part of the colon, possible bladder or sexual dysfunction if having rectal surgery, possible constipation from narcotics, prolonged fatigue/weakness or appetite loss, possible early recurrence of of disease, possible complications of their medical problems such as heart disease or arrhythmias or lung problems, death (less than 1%). I believe the patient understands and wishes to proceed with the surgery. Current Plans  Pt Education - [facility] Diverticular Disease (AT) Pt Education - [facility] Bowel Prep Started Neomycin Sulfate 500MG , 2 (two) Tablet four times daily, #6, 07/12/2014, No Refill. Local Order: TAKE TWO TABLETS AT 2 PM, 3 PM, AND 10 PM THE DAY PRIOR TO SURGERY Started Flagyl 500MG , 2 (two) Tablet SEE NOTE, #6, 07/12/2014, No Refill. Local Order: Take at 2pm, 3pm, and 10pm the day prior to your colon operation   Signed by Romie LeveeAlicia Lorenzo Pereyra, MD (07/12/2014 11:45 AM)

## 2014-08-08 ENCOUNTER — Encounter (HOSPITAL_COMMUNITY): Payer: Self-pay

## 2014-08-09 ENCOUNTER — Other Ambulatory Visit (HOSPITAL_COMMUNITY): Payer: Self-pay | Admitting: *Deleted

## 2014-08-10 ENCOUNTER — Encounter (HOSPITAL_COMMUNITY)
Admission: RE | Admit: 2014-08-10 | Discharge: 2014-08-10 | Disposition: A | Discharge status: Home / Self Care | Payer: Medicare Other | Source: Ambulatory Visit | Attending: General Surgery | Admitting: General Surgery

## 2014-08-10 ENCOUNTER — Ambulatory Visit (HOSPITAL_COMMUNITY)
Admission: RE | Admit: 2014-08-10 | Discharge: 2014-08-10 | Disposition: A | Discharge status: Home / Self Care | Payer: Medicare Other | Source: Ambulatory Visit | Attending: Anesthesiology | Admitting: Anesthesiology

## 2014-08-10 ENCOUNTER — Encounter (HOSPITAL_COMMUNITY): Payer: Self-pay

## 2014-08-10 DIAGNOSIS — I10 Essential (primary) hypertension: Secondary | ICD-10-CM | POA: Diagnosis not present

## 2014-08-10 DIAGNOSIS — Z79899 Other long term (current) drug therapy: Secondary | ICD-10-CM | POA: Diagnosis not present

## 2014-08-10 HISTORY — DX: Bronchitis, not specified as acute or chronic: J40

## 2014-08-10 HISTORY — DX: Cardiac murmur, unspecified: R01.1

## 2014-08-10 LAB — CBC
HCT: 35.2 % — ABNORMAL LOW (ref 36.0–46.0)
Hemoglobin: 11.4 g/dL — ABNORMAL LOW (ref 12.0–15.0)
MCH: 26.3 pg (ref 26.0–34.0)
MCHC: 32.4 g/dL (ref 30.0–36.0)
MCV: 81.1 fL (ref 78.0–100.0)
PLATELETS: 282 10*3/uL (ref 150–400)
RBC: 4.34 MIL/uL (ref 3.87–5.11)
RDW: 16.3 % — ABNORMAL HIGH (ref 11.5–15.5)
WBC: 5.9 10*3/uL (ref 4.0–10.5)

## 2014-08-10 LAB — BASIC METABOLIC PANEL
Anion gap: 10 (ref 5–15)
BUN: 19 mg/dL (ref 6–23)
CHLORIDE: 102 meq/L (ref 96–112)
CO2: 27 mEq/L (ref 19–32)
Calcium: 9.7 mg/dL (ref 8.4–10.5)
Creatinine, Ser: 1.1 mg/dL (ref 0.50–1.10)
GFR calc Af Amer: 54 mL/min — ABNORMAL LOW (ref >90)
GFR, EST NON AFRICAN AMERICAN: 46 mL/min — AB (ref >90)
GLUCOSE: 98 mg/dL (ref 70–99)
POTASSIUM: 4.6 meq/L (ref 3.7–5.3)
Sodium: 139 mEq/L (ref 137–147)

## 2014-08-10 LAB — HEMOGLOBIN A1C
HEMOGLOBIN A1C: 6.4 % — AB (ref <5.7)
Mean Plasma Glucose: 137 mg/dL — ABNORMAL HIGH (ref <117)

## 2014-08-10 LAB — ABO/RH: ABO/RH(D): O POS

## 2014-08-10 NOTE — Progress Notes (Signed)
Pt states medicare has her as Kayla Austin has her as [name] pts full name is [name] per her driver licenses  Dr Jacinto HalimGanji note per chart from 04/05/2014

## 2014-08-10 NOTE — Patient Instructions (Signed)
20 Kayla Austin  08/10/2014   Your procedure is scheduled on:        08/18/2014  Report [facility] and follow signs to  Short Stay Center arrive at 0530 AM.  Call this number if you have problems the morning of surgery [phone] or Presurgical Testing [phone].   Remember:  Do not eat food or drink liquids :After Midnight.   Remember: follow any bowel prep instructions per MD office.      Take these medicines the morning of surgery with A SIP OF WATER: Metoprolol                               You may not have any metal on your body including hair pins and piercings  Do not wear jewelry, make-up, lotions, powders, or deodorant.  Do not shave body hair  48 hours(2 days) of CHG soap use.                Do not bring valuables to the hospital. [facility] IS NOT RESPONSIBLE FOR VALUABLES.  Contacts, dentures or bridgework may not be worn into surgery.  Leave suitcase in the car. After surgery it may be brought to your room.  For patients admitted to the hospital, checkout time is 11:00 AM the day of discharge.  ________________________________________________________________________  [facility] - Preparing [facility] Before surgery, you can play an important role.  Because skin is not sterile, your skin needs to be as free of germs as possible.  You can reduce the number of germs on your skin by washing with CHG (chlorahexidine gluconate) soap before surgery.  CHG is an antiseptic cleaner which kills germs and bonds with the skin to continue killing germs even after washing. Please DO NOT use if you have an allergy to CHG or antibacterial soaps.  If your skin becomes reddened/irritated stop using the CHG and inform your nurse when you arrive at Short Stay. Do not shave (including legs and underarms) for at least 48 hours prior to the first CHG shower.  You may shave your face/neck. Please follow these instructions carefully:  1.  Shower with CHG Soap  the night before surgery and the  morning of Surgery.  2.  If you choose to wash your hair, wash your hair first as usual with your  normal  shampoo.  3.  After you shampoo, rinse your hair and body thoroughly to remove the  shampoo.                           4.  Use CHG as you would any other liquid soap.  You can apply chg directly  to the skin and wash                       Gently with a scrungie or clean washcloth.  5.  Apply the CHG Soap to your body ONLY FROM THE NECK DOWN.   Do not use on face/ open                           Wound or open sores. Avoid contact with eyes, ears mouth and genitals (private parts).                       Wash face,  Genitals (  private parts) with your normal soap.             6.  Wash thoroughly, paying special attention to the area where your surgery  will be performed.  7.  Thoroughly rinse your body with warm water from the neck down.  8.  DO NOT shower/wash with your normal soap after using and rinsing off  the CHG Soap.                9.  Pat yourself dry with a clean towel.            10.  Wear clean pajamas.            11.  Place clean sheets on your bed the night of your first shower and do not  sleep with pets. Day of Surgery : Do not apply any lotions/deodorants the morning of surgery.  Please wear clean clothes to the hospital/surgery center.  FAILURE TO FOLLOW THESE INSTRUCTIONS MAY RESULT IN THE CANCELLATION OF YOUR SURGERY PATIENT SIGNATURE_________________________________  NURSE SIGNATURE__________________________________  ________________________________________________________________________

## 2014-08-14 NOTE — Progress Notes (Signed)
EKG per PAT visit on 08/10/2014 seen per Dr Leta JunglingEwell with Anesthesia awaiting clearance note per Dr Jacinto HalimGanji in which EKG was faxed to Dr Verl DickerGanji's office on 08/14/2014 spoke with Florentina AddisonKatie also notified Dr Maurine Ministerhomas's office spoke with Marcelino DusterMichelle in regards to EKG and need to have Clearance note prior to Surgery on 08/18/2014.

## 2014-08-15 NOTE — Progress Notes (Signed)
Cardiac clearance note dr Jacinto Halimganji on chart for 08-18-14 surgery

## 2014-08-17 NOTE — Anesthesia Preprocedure Evaluation (Addendum)
Anesthesia Evaluation  Patient identified by MRN, date of birth, ID band Patient awake    Reviewed: Allergy & Precautions, H&P , NPO status , Patient's Chart, lab work & pertinent test results  Airway Mallampati: II TM Distance: >3 FB Neck ROM: Full    Dental  (+) Edentulous Upper   Pulmonary neg pulmonary ROS,  breath sounds clear to auscultation  Pulmonary exam normal       Cardiovascular hypertension, Pt. on medications and Pt. on home beta blockers + dysrhythmias Atrial Fibrillation Rhythm:Irregular Rate:Normal     Neuro/Psych negative neurological ROS  negative psych ROS   GI/Hepatic negative GI ROS, Neg liver ROS,   Endo/Other  negative endocrine ROS  Renal/GU Renal InsufficiencyRenal disease  negative genitourinary   Musculoskeletal negative musculoskeletal ROS (+)   Abdominal   Peds negative pediatric ROS (+)  Hematology negative hematology ROS (+)   Anesthesia Other Findings   Reproductive/Obstetrics negative OB ROS                         Anesthesia Physical Anesthesia Plan  ASA: III  Anesthesia Plan: General   Post-op Pain Management:    Induction: Intravenous  Airway Management Planned: Oral ETT  Additional Equipment: Arterial line  Intra-op Plan:   Post-operative Plan: Extubation in OR  Informed Consent: I have reviewed the patients History and Physical, chart, labs and discussed the procedure including the risks, benefits and alternatives for the proposed anesthesia with the patient or authorized representative who has indicated his/her understanding and acceptance.   Dental advisory given  Plan Discussed with: CRNA and Surgeon  Anesthesia Plan Comments:         Anesthesia Quick Evaluation

## 2014-08-18 ENCOUNTER — Inpatient Hospital Stay (HOSPITAL_COMMUNITY)
Admission: RE | Admit: 2014-08-18 | Discharge: 2014-08-30 | DRG: 330 | Disposition: A | Discharge status: Skilled Nursing Facility | Payer: Medicare Other | Source: Ambulatory Visit | Attending: General Surgery | Admitting: General Surgery

## 2014-08-18 ENCOUNTER — Encounter (HOSPITAL_COMMUNITY): Payer: Self-pay | Admitting: *Deleted

## 2014-08-18 ENCOUNTER — Encounter (HOSPITAL_COMMUNITY)
Admission: RE | Disposition: A | Discharge status: Skilled Nursing Facility | Payer: Self-pay | Source: Ambulatory Visit | Attending: General Surgery

## 2014-08-18 ENCOUNTER — Encounter (HOSPITAL_COMMUNITY): Payer: Medicare Other | Admitting: Anesthesiology

## 2014-08-18 ENCOUNTER — Ambulatory Visit (HOSPITAL_COMMUNITY): Payer: Medicare Other | Admitting: Anesthesiology

## 2014-08-18 DIAGNOSIS — K567 Ileus, unspecified: Secondary | ICD-10-CM | POA: Diagnosis not present

## 2014-08-18 DIAGNOSIS — Z79899 Other long term (current) drug therapy: Secondary | ICD-10-CM

## 2014-08-18 DIAGNOSIS — N183 Chronic kidney disease, stage 3 (moderate): Secondary | ICD-10-CM | POA: Diagnosis present

## 2014-08-18 DIAGNOSIS — N824 Other female intestinal-genital tract fistulae: Secondary | ICD-10-CM | POA: Diagnosis present

## 2014-08-18 DIAGNOSIS — Z7901 Long term (current) use of anticoagulants: Secondary | ICD-10-CM | POA: Diagnosis not present

## 2014-08-18 DIAGNOSIS — I129 Hypertensive chronic kidney disease with stage 1 through stage 4 chronic kidney disease, or unspecified chronic kidney disease: Secondary | ICD-10-CM | POA: Diagnosis present

## 2014-08-18 DIAGNOSIS — E876 Hypokalemia: Secondary | ICD-10-CM | POA: Diagnosis not present

## 2014-08-18 DIAGNOSIS — K5790 Diverticulosis of intestine, part unspecified, without perforation or abscess without bleeding: Secondary | ICD-10-CM | POA: Diagnosis present

## 2014-08-18 DIAGNOSIS — Z880 Allergy status to penicillin: Secondary | ICD-10-CM | POA: Diagnosis not present

## 2014-08-18 DIAGNOSIS — K649 Unspecified hemorrhoids: Secondary | ICD-10-CM | POA: Diagnosis present

## 2014-08-18 DIAGNOSIS — I4891 Unspecified atrial fibrillation: Secondary | ICD-10-CM | POA: Diagnosis present

## 2014-08-18 DIAGNOSIS — N823 Fistula of vagina to large intestine: Secondary | ICD-10-CM | POA: Diagnosis present

## 2014-08-18 DIAGNOSIS — K269 Duodenal ulcer, unspecified as acute or chronic, without hemorrhage or perforation: Secondary | ICD-10-CM | POA: Diagnosis present

## 2014-08-18 DIAGNOSIS — Z86010 Personal history of colonic polyps: Secondary | ICD-10-CM

## 2014-08-18 DIAGNOSIS — R1110 Vomiting, unspecified: Secondary | ICD-10-CM

## 2014-08-18 DIAGNOSIS — E780 Pure hypercholesterolemia: Secondary | ICD-10-CM | POA: Diagnosis present

## 2014-08-18 DIAGNOSIS — R197 Diarrhea, unspecified: Secondary | ICD-10-CM

## 2014-08-18 DIAGNOSIS — K219 Gastro-esophageal reflux disease without esophagitis: Secondary | ICD-10-CM | POA: Diagnosis present

## 2014-08-18 LAB — TYPE AND SCREEN
ABO/RH(D): O POS
ANTIBODY SCREEN: NEGATIVE

## 2014-08-18 SURGERY — COLECTOMY, PARTIAL, ROBOT-ASSISTED, LAPAROSCOPIC
Anesthesia: General | Site: Abdomen

## 2014-08-18 MED ORDER — FENTANYL CITRATE 0.05 MG/ML IJ SOLN
INTRAMUSCULAR | Status: AC
  Filled 2014-08-18: qty 2

## 2014-08-18 MED ORDER — LABETALOL HCL 5 MG/ML IV SOLN
INTRAVENOUS | Status: AC
  Filled 2014-08-18: qty 4

## 2014-08-18 MED ORDER — HEPARIN SODIUM (PORCINE) 5000 UNIT/ML IJ SOLN
5000.0000 [IU] | Freq: Once | INTRAMUSCULAR | Status: AC
  Administered 2014-08-18: 5000 [IU] via SUBCUTANEOUS
  Filled 2014-08-18: qty 1

## 2014-08-18 MED ORDER — GENTAMICIN SULFATE 40 MG/ML IJ SOLN
5.0000 mg/kg | INTRAMUSCULAR | Status: AC
  Administered 2014-08-18: 410 mg via INTRAVENOUS
  Filled 2014-08-18: qty 10.25

## 2014-08-18 MED ORDER — ONDANSETRON HCL 4 MG/2ML IJ SOLN
INTRAMUSCULAR | Status: DC | PRN
  Administered 2014-08-18: 4 mg via INTRAVENOUS

## 2014-08-18 MED ORDER — PROMETHAZINE HCL 25 MG/ML IJ SOLN: 6.2500 mg | INTRAMUSCULAR | Status: DC | PRN

## 2014-08-18 MED ORDER — ONDANSETRON HCL 4 MG PO TABS: 4.0000 mg | ORAL_TABLET | Freq: Four times a day (QID) | ORAL | Status: DC | PRN

## 2014-08-18 MED ORDER — PROPOFOL 10 MG/ML IV BOLUS
INTRAVENOUS | Status: DC | PRN
  Administered 2014-08-18: 120 mg via INTRAVENOUS

## 2014-08-18 MED ORDER — EPHEDRINE SULFATE 50 MG/ML IJ SOLN
INTRAMUSCULAR | Status: DC | PRN
  Administered 2014-08-18 (×4): 10 mg via INTRAVENOUS
  Administered 2014-08-18 (×2): 5 mg via INTRAVENOUS

## 2014-08-18 MED ORDER — HYDROMORPHONE HCL 1 MG/ML IJ SOLN
0.5000 mg | INTRAMUSCULAR | Status: DC | PRN
  Administered 2014-08-19 – 2014-08-28 (×17): 0.5 mg via INTRAVENOUS
  Filled 2014-08-18 (×17): qty 1

## 2014-08-18 MED ORDER — EPHEDRINE SULFATE 50 MG/ML IJ SOLN
INTRAMUSCULAR | Status: AC
  Filled 2014-08-18: qty 1

## 2014-08-18 MED ORDER — HYDROMORPHONE HCL 1 MG/ML IJ SOLN
INTRAMUSCULAR | Status: AC
  Filled 2014-08-18: qty 1

## 2014-08-18 MED ORDER — ONDANSETRON HCL 4 MG/2ML IJ SOLN
4.0000 mg | Freq: Four times a day (QID) | INTRAMUSCULAR | Status: DC | PRN
  Administered 2014-08-19 – 2014-08-28 (×12): 4 mg via INTRAVENOUS
  Filled 2014-08-18 (×12): qty 2

## 2014-08-18 MED ORDER — ROCURONIUM BROMIDE 100 MG/10ML IV SOLN
INTRAVENOUS | Status: DC | PRN
  Administered 2014-08-18: 50 mg via INTRAVENOUS
  Administered 2014-08-18: 10 mg via INTRAVENOUS

## 2014-08-18 MED ORDER — HYDROMORPHONE HCL 2 MG/ML IJ SOLN
INTRAMUSCULAR | Status: AC
  Filled 2014-08-18: qty 1

## 2014-08-18 MED ORDER — LIDOCAINE HCL (CARDIAC) 20 MG/ML IV SOLN
INTRAVENOUS | Status: DC | PRN
  Administered 2014-08-18: 100 mg via INTRAVENOUS

## 2014-08-18 MED ORDER — SODIUM CHLORIDE 0.9 % IV SOLN
INTRAVENOUS | Status: DC
  Administered 2014-08-18: 18:00:00 via INTRAVENOUS
  Administered 2014-08-19: 100 mL/h via INTRAVENOUS
  Administered 2014-08-20: 50 mL/h via INTRAVENOUS
  Administered 2014-08-21: 06:00:00 via INTRAVENOUS
  Administered 2014-08-23: 100 mL/h via INTRAVENOUS
  Administered 2014-08-23: 21:00:00 via INTRAVENOUS

## 2014-08-18 MED ORDER — DEXAMETHASONE SODIUM PHOSPHATE 10 MG/ML IJ SOLN
INTRAMUSCULAR | Status: AC
  Filled 2014-08-18: qty 1

## 2014-08-18 MED ORDER — METOPROLOL TARTRATE 1 MG/ML IV SOLN
5.0000 mg | Freq: Four times a day (QID) | INTRAVENOUS | Status: DC | PRN
  Administered 2014-08-22 – 2014-08-23 (×2): 5 mg via INTRAVENOUS
  Filled 2014-08-18 (×2): qty 5

## 2014-08-18 MED ORDER — ESMOLOL HCL 10 MG/ML IV SOLN
INTRAVENOUS | Status: AC
  Filled 2014-08-18: qty 10

## 2014-08-18 MED ORDER — CLINDAMYCIN PHOSPHATE 900 MG/50ML IV SOLN
900.0000 mg | Freq: Three times a day (TID) | INTRAVENOUS | Status: AC
  Administered 2014-08-18: 900 mg via INTRAVENOUS
  Filled 2014-08-18: qty 50

## 2014-08-18 MED ORDER — ENOXAPARIN SODIUM 40 MG/0.4ML ~~LOC~~ SOLN
40.0000 mg | SUBCUTANEOUS | Status: DC
  Administered 2014-08-19 – 2014-08-22 (×4): 40 mg via SUBCUTANEOUS
  Filled 2014-08-18 (×5): qty 0.4

## 2014-08-18 MED ORDER — LIDOCAINE HCL (CARDIAC) 20 MG/ML IV SOLN
INTRAVENOUS | Status: AC
  Filled 2014-08-18: qty 5

## 2014-08-18 MED ORDER — ALVIMOPAN 12 MG PO CAPS
12.0000 mg | ORAL_CAPSULE | Freq: Once | ORAL | Status: AC
  Administered 2014-08-18: 12 mg via ORAL
  Filled 2014-08-18: qty 1

## 2014-08-18 MED ORDER — CLINDAMYCIN PHOSPHATE 900 MG/50ML IV SOLN
INTRAVENOUS | Status: AC
  Filled 2014-08-18: qty 50

## 2014-08-18 MED ORDER — FENTANYL CITRATE 0.05 MG/ML IJ SOLN
INTRAMUSCULAR | Status: AC
  Filled 2014-08-18: qty 5

## 2014-08-18 MED ORDER — METHYLENE BLUE 1 % INJ SOLN
INTRAMUSCULAR | Status: DC | PRN
  Administered 2014-08-18: 5 mL via INTRAVENOUS

## 2014-08-18 MED ORDER — NEOSTIGMINE METHYLSULFATE 10 MG/10ML IV SOLN
INTRAVENOUS | Status: DC | PRN
  Administered 2014-08-18: 5 mg via INTRAVENOUS

## 2014-08-18 MED ORDER — LACTATED RINGERS IV SOLN
INTRAVENOUS | Status: DC | PRN
  Administered 2014-08-18 (×4): via INTRAVENOUS

## 2014-08-18 MED ORDER — DEXAMETHASONE SODIUM PHOSPHATE 10 MG/ML IJ SOLN
INTRAMUSCULAR | Status: DC | PRN
  Administered 2014-08-18: 10 mg via INTRAVENOUS

## 2014-08-18 MED ORDER — ONDANSETRON HCL 4 MG/2ML IJ SOLN
INTRAMUSCULAR | Status: AC
  Filled 2014-08-18: qty 2

## 2014-08-18 MED ORDER — LABETALOL HCL 5 MG/ML IV SOLN
INTRAVENOUS | Status: DC | PRN
  Administered 2014-08-18: 2.5 mg via INTRAVENOUS

## 2014-08-18 MED ORDER — METHYLENE BLUE 1 % INJ SOLN
INTRAMUSCULAR | Status: AC
Start: 2014-08-18 — End: 2014-08-18
  Filled 2014-08-18: qty 10

## 2014-08-18 MED ORDER — CLINDAMYCIN PHOSPHATE 900 MG/50ML IV SOLN
900.0000 mg | INTRAVENOUS | Status: AC
  Administered 2014-08-18: 900 mg via INTRAVENOUS

## 2014-08-18 MED ORDER — LACTATED RINGERS IV SOLN
INTRAVENOUS | Status: DC | PRN
  Administered 2014-08-18: 07:00:00 via INTRAVENOUS

## 2014-08-18 MED ORDER — APIXABAN 5 MG PO TABS: 5.0000 mg | ORAL_TABLET | Freq: Two times a day (BID) | ORAL | Status: DC

## 2014-08-18 MED ORDER — INDOCYANINE GREEN 25 MG IV SOLR
INTRAVENOUS | Status: DC | PRN
  Administered 2014-08-18: 25 mg via INTRAVENOUS
  Administered 2014-08-18: 2 mg via INTRAVENOUS

## 2014-08-18 MED ORDER — HYDROMORPHONE HCL 1 MG/ML IJ SOLN
0.2500 mg | INTRAMUSCULAR | Status: DC | PRN
  Administered 2014-08-18 (×4): 0.5 mg via INTRAVENOUS

## 2014-08-18 MED ORDER — METOPROLOL TARTRATE 50 MG PO TABS
50.0000 mg | ORAL_TABLET | Freq: Two times a day (BID) | ORAL | Status: DC
  Administered 2014-08-18 – 2014-08-30 (×24): 50 mg via ORAL
  Filled 2014-08-18 (×6): qty 1
  Filled 2014-08-18: qty 2
  Filled 2014-08-18 (×13): qty 1
  Filled 2014-08-18: qty 2
  Filled 2014-08-18 (×5): qty 1

## 2014-08-18 MED ORDER — ROCURONIUM BROMIDE 100 MG/10ML IV SOLN
INTRAVENOUS | Status: AC
  Filled 2014-08-18: qty 1

## 2014-08-18 MED ORDER — ESMOLOL HCL 10 MG/ML IV SOLN
INTRAVENOUS | Status: DC | PRN
  Administered 2014-08-18: 20 mg via INTRAVENOUS

## 2014-08-18 MED ORDER — DIPHENHYDRAMINE HCL 12.5 MG/5ML PO ELIX
12.5000 mg | ORAL_SOLUTION | Freq: Four times a day (QID) | ORAL | Status: DC | PRN
  Administered 2014-08-19: 12.5 mg via ORAL
  Filled 2014-08-18: qty 5

## 2014-08-18 MED ORDER — ATORVASTATIN CALCIUM 10 MG PO TABS
10.0000 mg | ORAL_TABLET | Freq: Every day | ORAL | Status: DC
  Administered 2014-08-18 – 2014-08-29 (×9): 10 mg via ORAL
  Filled 2014-08-18 (×13): qty 1

## 2014-08-18 MED ORDER — 0.9 % SODIUM CHLORIDE (POUR BTL) OPTIME
TOPICAL | Status: DC | PRN
  Administered 2014-08-18: 2000 mL

## 2014-08-18 MED ORDER — BUPIVACAINE-EPINEPHRINE (PF) 0.25% -1:200000 IJ SOLN
INTRAMUSCULAR | Status: AC
  Filled 2014-08-18: qty 30

## 2014-08-18 MED ORDER — BUPIVACAINE-EPINEPHRINE 0.25% -1:200000 IJ SOLN
INTRAMUSCULAR | Status: DC | PRN
  Administered 2014-08-18: 30 mL

## 2014-08-18 MED ORDER — SODIUM CHLORIDE 0.9 % IJ SOLN
INTRAMUSCULAR | Status: AC
  Filled 2014-08-18: qty 10

## 2014-08-18 MED ORDER — DIPHENHYDRAMINE HCL 50 MG/ML IJ SOLN: 12.5000 mg | Freq: Four times a day (QID) | INTRAMUSCULAR | Status: DC | PRN

## 2014-08-18 MED ORDER — ALVIMOPAN 12 MG PO CAPS
12.0000 mg | ORAL_CAPSULE | Freq: Two times a day (BID) | ORAL | Status: DC
  Administered 2014-08-19 (×2): 12 mg via ORAL
  Filled 2014-08-18 (×4): qty 1

## 2014-08-18 MED ORDER — LACTATED RINGERS IR SOLN
Status: DC | PRN
  Administered 2014-08-18: 1000 mL

## 2014-08-18 MED ORDER — SUCCINYLCHOLINE CHLORIDE 20 MG/ML IJ SOLN
INTRAMUSCULAR | Status: DC | PRN
  Administered 2014-08-18: 100 mg via INTRAVENOUS

## 2014-08-18 MED ORDER — PHENYLEPHRINE HCL 10 MG/ML IJ SOLN
INTRAMUSCULAR | Status: AC
  Filled 2014-08-18: qty 1

## 2014-08-18 MED ORDER — PROPOFOL 10 MG/ML IV BOLUS
INTRAVENOUS | Status: AC
  Filled 2014-08-18: qty 20

## 2014-08-18 MED ORDER — ACETAMINOPHEN 500 MG PO TABS
1000.0000 mg | ORAL_TABLET | Freq: Four times a day (QID) | ORAL | Status: AC
  Administered 2014-08-18 – 2014-08-19 (×2): 1000 mg via ORAL
  Filled 2014-08-18 (×2): qty 2

## 2014-08-18 MED ORDER — FENTANYL CITRATE 0.05 MG/ML IJ SOLN
INTRAMUSCULAR | Status: DC | PRN
  Administered 2014-08-18 (×5): 50 ug via INTRAVENOUS
  Administered 2014-08-18: 25 ug via INTRAVENOUS
  Administered 2014-08-18: 50 ug via INTRAVENOUS
  Administered 2014-08-18: 25 ug via INTRAVENOUS
  Administered 2014-08-18: 50 ug via INTRAVENOUS
  Administered 2014-08-18 (×2): 25 ug via INTRAVENOUS

## 2014-08-18 MED ORDER — GLYCOPYRROLATE 0.2 MG/ML IJ SOLN
INTRAMUSCULAR | Status: DC | PRN
  Administered 2014-08-18: 0.6 mg via INTRAVENOUS

## 2014-08-18 MED ORDER — LOSARTAN POTASSIUM 25 MG PO TABS
25.0000 mg | ORAL_TABLET | Freq: Every day | ORAL | Status: DC
  Administered 2014-08-19 – 2014-08-29 (×11): 25 mg via ORAL
  Filled 2014-08-18 (×12): qty 1

## 2014-08-18 MED ORDER — HYDROMORPHONE HCL 1 MG/ML IJ SOLN
INTRAMUSCULAR | Status: DC | PRN
Start: 2014-08-18 — End: 2014-08-18
  Administered 2014-08-18 (×4): 0.5 mg via INTRAVENOUS

## 2014-08-18 MED ORDER — HYDROCHLOROTHIAZIDE 25 MG PO TABS
25.0000 mg | ORAL_TABLET | Freq: Every day | ORAL | Status: DC
  Administered 2014-08-19 – 2014-08-30 (×12): 25 mg via ORAL
  Filled 2014-08-18 (×12): qty 1

## 2014-08-18 SURGICAL SUPPLY — 95 items
BLADE EXTENDED COATED 6.5IN (ELECTRODE) ×3 IMPLANT
BLADE SURG SZ11 CARB STEEL (BLADE) ×3 IMPLANT
CANNULA REDUC XI 12-8 STAPL (CANNULA) ×2
CANNULA REDUC XI 12-8MM STAPL (CANNULA) ×2
CANNULA REDUCER 12-8 DVNC XI (CANNULA) ×2 IMPLANT
CELLS DAT CNTRL 66122 CELL SVR (MISCELLANEOUS) IMPLANT
CHLORAPREP W/TINT 26ML (MISCELLANEOUS) ×3 IMPLANT
CLIP LIGATING HEM O LOK PURPLE (MISCELLANEOUS) IMPLANT
CLIP LIGATING HEMOLOK MED (MISCELLANEOUS) IMPLANT
COVER MAYO STAND STRL (DRAPES) ×3 IMPLANT
COVER TIP SHEARS 8 DVNC (MISCELLANEOUS) ×1 IMPLANT
COVER TIP SHEARS 8MM DA VINCI (MISCELLANEOUS) ×2
DECANTER SPIKE VIAL GLASS SM (MISCELLANEOUS) IMPLANT
DEVICE TROCAR PUNCTURE CLOSURE (ENDOMECHANICALS) IMPLANT
DRAIN CHANNEL 19F RND (DRAIN) ×3 IMPLANT
DRAPE ARM DVNC X/XI (DISPOSABLE) ×4 IMPLANT
DRAPE COLUMN DVNC XI (DISPOSABLE) ×1 IMPLANT
DRAPE DA VINCI XI ARM (DISPOSABLE) ×8
DRAPE DA VINCI XI COLUMN (DISPOSABLE) ×2
DRAPE SHEET LG 3/4 BI-LAMINATE (DRAPES) ×6 IMPLANT
DRAPE SURG IRRIG POUCH 19X23 (DRAPES) ×3 IMPLANT
DRAPE WARM FLUID 44X44 (DRAPE) ×3 IMPLANT
DRSG OPSITE POSTOP 4X10 (GAUZE/BANDAGES/DRESSINGS) IMPLANT
DRSG OPSITE POSTOP 4X6 (GAUZE/BANDAGES/DRESSINGS) ×3 IMPLANT
DRSG OPSITE POSTOP 4X8 (GAUZE/BANDAGES/DRESSINGS) IMPLANT
ELECT PENCIL ROCKER SW 15FT (MISCELLANEOUS) ×9 IMPLANT
ELECT REM PT RETURN 15FT ADLT (MISCELLANEOUS) ×3 IMPLANT
ENDOLOOP SUT PDS II  0 18 (SUTURE)
ENDOLOOP SUT PDS II 0 18 (SUTURE) IMPLANT
EVACUATOR DRAINAGE 10X20 100CC (DRAIN) ×1 IMPLANT
EVACUATOR SILICONE 100CC (DRAIN) ×2 IMPLANT
GAUZE SPONGE 4X4 12PLY STRL (GAUZE/BANDAGES/DRESSINGS) IMPLANT
GLOVE BIO SURGEON STRL SZ 6.5 (GLOVE) ×6 IMPLANT
GLOVE BIO SURGEONS STRL SZ 6.5 (GLOVE) ×3
GLOVE BIOGEL PI IND STRL 7.0 (GLOVE) ×3 IMPLANT
GLOVE BIOGEL PI INDICATOR 7.0 (GLOVE) ×6
GOWN STRL REUS W/TWL 2XL LVL3 (GOWN DISPOSABLE) ×9 IMPLANT
GOWN STRL REUS W/TWL XL LVL3 (GOWN DISPOSABLE) ×18 IMPLANT
HOLDER FOLEY CATH W/STRAP (MISCELLANEOUS) ×3 IMPLANT
KIT BASIN OR (CUSTOM PROCEDURE TRAY) ×6 IMPLANT
LEGGING LITHOTOMY PAIR STRL (DRAPES) ×3 IMPLANT
NEEDLE HYPO 22GX1.5 SAFETY (NEEDLE) ×3 IMPLANT
NEEDLE INSUFFLATION 14GA 120MM (NEEDLE) ×3 IMPLANT
PACK CARDIOVASCULAR III (CUSTOM PROCEDURE TRAY) ×3 IMPLANT
PACK GENERAL/GYN (CUSTOM PROCEDURE TRAY) ×3 IMPLANT
PORT LAP GEL ALEXIS MED 5-9CM (MISCELLANEOUS) ×3 IMPLANT
RTRCTR WOUND ALEXIS 18CM MED (MISCELLANEOUS)
SCISSORS LAP 5X45 EPIX DISP (ENDOMECHANICALS) ×3 IMPLANT
SEAL CANN UNIV 5-8 DVNC XI (MISCELLANEOUS) ×3 IMPLANT
SEAL XI 5MM-8MM UNIVERSAL (MISCELLANEOUS) ×6
SEALER VESSEL DA VINCI XI (MISCELLANEOUS) ×2
SEALER VESSEL EXT DVNC XI (MISCELLANEOUS) ×1 IMPLANT
SET IRRIG TUBING LAPAROSCOPIC (IRRIGATION / IRRIGATOR) ×3 IMPLANT
SLEEVE XCEL OPT CAN 5 100 (ENDOMECHANICALS) ×3 IMPLANT
SOLUTION ELECTROLUBE (MISCELLANEOUS) ×3 IMPLANT
SPONGE LAP 18X18 X RAY DECT (DISPOSABLE) IMPLANT
STAPLER 45 BLU RELOAD XI (STAPLE) ×1 IMPLANT
STAPLER 45 BLUE RELOAD XI (STAPLE) ×2
STAPLER 45 GREEN RELOAD XI (STAPLE)
STAPLER 45 GRN RELOAD XI (STAPLE) IMPLANT
STAPLER CANNULA SEAL DVNC XI (STAPLE) IMPLANT
STAPLER CANNULA SEAL XI (STAPLE)
STAPLER CIRC CVD 29MM 37CM (STAPLE) ×3 IMPLANT
STAPLER SHEATH (SHEATH) ×2
STAPLER SHEATH ENDOWRIST DVNC (SHEATH) ×1 IMPLANT
STAPLER VISISTAT 35W (STAPLE) ×3 IMPLANT
SUCTION POOLE TIP (SUCTIONS) ×3 IMPLANT
SUT ETHILON 2 0 PS N (SUTURE) ×3 IMPLANT
SUT PDS AB 1 CTX 36 (SUTURE) ×6 IMPLANT
SUT PDS AB 1 TP1 96 (SUTURE) IMPLANT
SUT PROLENE 2 0 KS (SUTURE) ×3 IMPLANT
SUT SILK 2 0 (SUTURE) ×2
SUT SILK 2 0 SH CR/8 (SUTURE) ×3 IMPLANT
SUT SILK 2-0 18XBRD TIE 12 (SUTURE) ×1 IMPLANT
SUT SILK 3 0 (SUTURE) ×2
SUT SILK 3 0 SH 30 (SUTURE) ×3 IMPLANT
SUT SILK 3 0 SH CR/8 (SUTURE) ×3 IMPLANT
SUT SILK 3-0 18XBRD TIE 12 (SUTURE) ×1 IMPLANT
SUT VIC AB 2-0 SH 18 (SUTURE) ×3 IMPLANT
SUT VIC AB 2-0 SH 27 (SUTURE) ×2
SUT VIC AB 2-0 SH 27X BRD (SUTURE) ×1 IMPLANT
SUT VIC AB 4-0 PS2 27 (SUTURE) ×9 IMPLANT
SUT VICRYL 0 UR6 27IN ABS (SUTURE) ×3 IMPLANT
SYR 20CC LL (SYRINGE) ×3 IMPLANT
SYRINGE 10CC LL (SYRINGE) ×3 IMPLANT
SYS LAPSCP GELPORT 120MM (MISCELLANEOUS)
SYSTEM LAPSCP GELPORT 120MM (MISCELLANEOUS) IMPLANT
TOWEL OR 17X26 10 PK STRL BLUE (TOWEL DISPOSABLE) ×6 IMPLANT
TOWEL OR NON WOVEN STRL DISP B (DISPOSABLE) ×3 IMPLANT
TRAY FOLEY CATH 14FRSI W/METER (CATHETERS) ×3 IMPLANT
TROCAR BLADELESS OPT 5 100 (ENDOMECHANICALS) ×3 IMPLANT
TUBING CONNECTING 10 (TUBING) IMPLANT
TUBING CONNECTING 10' (TUBING)
TUBING FILTER THERMOFLATOR (ELECTROSURGICAL) ×3 IMPLANT
YANKAUER SUCT BULB TIP 10FT TU (MISCELLANEOUS) ×3 IMPLANT

## 2014-08-18 NOTE — H&P (View-Only) (Signed)
Kayla Austin 07/12/2014 10:34 AM Location: [facility] Patient #: [id number] DOB: 01/31/1932 Separated / Language: [place] / Race: Black or African American Female  History of Present Illness Kayla Austin(Kayla Brubeck MD; 07/12/2014 11:42 AM) Patient words: Referred for possible fistula.  The patient is a 80 year old female who presents with a complaint of colovaginal fistula. Patient presents to the office with complaint of stool per vagina. She has been evaluated by Dr. Elnoria HowardHung for a colovaginal fistula. She is underwent a barium enema which showed a distal sigmoid to vaginal apex fistula. Her last full colonoscopy showed 2 polyps in the transverse colon. She is due for another full colonoscopy this year. She did undergo a flexible sigmoidoscopy which showed no signs of tumor and significant diverticulosis. Of note the patient has stage III hypertensive kidney disease. She also has a history of atrial fibrillation requiring cardioversion.   Other Problems Renato Gails(Rachel McMillen; 07/12/2014 10:34 AM) Gastroesophageal Reflux Disease Heart murmur Hemorrhoids High blood pressure Hypercholesterolemia  Past Surgical History Renato Gails(Rachel McMillen; 07/12/2014 10:34 AM) Hysterectomy (not due to cancer) - Partial  Diagnostic Studies History Renato Gails(Rachel McMillen; 07/12/2014 10:34 AM) Mammogram within last year  Allergies Renato Gails(Rachel McMillen; 07/12/2014 10:35 AM) Ampicillin *PENICILLINS*  Medication History Fleet Contras(Rachel McMillen; 07/12/2014 10:36 AM) Eliquis (5MG  Tablet, Oral dtw) Active. Metoprolol Tartrate (50MG  Tablet, Oral two times daily) Active. Atorvastatin Calcium (10MG  Tablet, Oral daily) Active. Hydrochlorothiazide (25MG  Tablet, Oral daily) Active. Losartan Potassium (25MG  Tablet, Oral daily) Active.  Review of Systems Fleet Contras(Rachel WarsawMcMillen; 07/12/2014 10:34 AM) General Present- Night Sweats. Not Present- Appetite Loss, Chills, Fatigue, Fever, Weight Gain and Weight Loss. HEENT Present- Seasonal  Allergies. Not Present- Earache, Hearing Loss, Hoarseness, Nose Bleed, Oral Ulcers, Ringing in the Ears, Sinus Pain, Sore Throat, Visual Disturbances, Wears glasses/contact lenses and Yellow Eyes. Cardiovascular Present- Leg Cramps. Not Present- Chest Pain, Difficulty Breathing Lying Down, Palpitations, Rapid Heart Rate, Shortness of Breath and Swelling of Extremities. Gastrointestinal Present- Bloating, Hemorrhoids and Nausea. Not Present- Abdominal Pain, Bloody Stool, Change in Bowel Habits, Chronic diarrhea, Constipation, Difficulty Swallowing, Excessive gas, Gets full quickly at meals, Indigestion, Rectal Pain and Vomiting.   Vitals Fleet Contras(Rachel ProspectMcMillen; 07/12/2014 10:35 AM) 07/12/2014 10:34 AM Weight: 177.5 lb Height: 63in Body Surface Area: 1.89 m Body Mass Index: 31.44 kg/m Temp.: 97.42F(Oral)  Pulse: 76 (Irregular)  Resp.: 18 (Unlabored)  BP: 138/84 (Sitting, Left Arm, Standard)    Physical Exam Kayla Austin(Hatsue Sime MD; 07/12/2014 11:24 AM) General Mental Status-Alert. General Appearance-Cooperative.  Chest and Lung Exam Chest and lung exam reveals -on auscultation, normal breath sounds, no adventitious sounds and normal vocal resonance.  Cardiovascular Auscultation Rhythm - Regular. Heart Sounds - Normal heart sounds.  Rectal Anorectal Exam External - normal external exam. Internal - normal internal exam and normal sphincter tone.    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 Possible sigmoidoscopy does not show any signs of a tumor or colon mass causing the fistula. I did discuss her renal function with Dr. Elenore RotaSadiq, her nephrologist. she explained to me that her creatinine was normal and her GFR was 73. She stated that her ultrasound showed a most likely benign cyst but could not rule out a neoplastic component. She is scheduled for a followup ultrasound in 6 months. She  did not feel that there was any other workup needed for this at the moment. Impression: I believe she will need a partial colon resection to improve her symptoms.I will  also send a cardiac clearance evaluation to Dr. Jacinto HalimGanji. once this information has been received, we will bring her back in to the office for further discussion of surgery and the risk entailed to perform the resection. The surgery and anatomy were described to the patient as well as the risks of surgery and the possible complications. These include: Bleeding, deep abdominal infections and possible wound complications such as hernia and infection, damage to adjacent structures, leak of surgical connections, which can lead to other surgeries and possibly an ostomy, possible need for other procedures, such as abscess drains in radiology, possible prolonged hospital stay, possible diarrhea from removal of part of the colon, possible bladder or sexual dysfunction if having rectal surgery, possible constipation from narcotics, prolonged fatigue/weakness or appetite loss, possible early recurrence of of disease, possible complications of their medical problems such as heart disease or arrhythmias or lung problems, death (less than 1%). I believe the patient understands and wishes to proceed with the surgery. Current Plans  Pt Education - [facility] Diverticular Disease (AT) Pt Education - [facility] Bowel Prep Started Neomycin Sulfate 500MG , 2 (two) Tablet four times daily, #6, 07/12/2014, No Refill. Local Order: TAKE TWO TABLETS AT 2 PM, 3 PM, AND 10 PM THE DAY PRIOR TO SURGERY Started Flagyl 500MG , 2 (two) Tablet SEE NOTE, #6, 07/12/2014, No Refill. Local Order: Take at 2pm, 3pm, and 10pm the day prior to your colon operation   Signed by Kayla LeveeAlicia Konica Stankowski, MD (07/12/2014 11:45 AM)

## 2014-08-18 NOTE — Anesthesia Postprocedure Evaluation (Signed)
  Anesthesia Post-op Note  Patient: Kayla Austin  Procedure(s) Performed: Procedure(s) (LRB): XI ROBOT ASSISTED LAPAROSCOPIC LOW ANTERIOR RESECTION (N/A)  Patient Location: PACU  Anesthesia Type: General  Level of Consciousness: awake and alert   Airway and Oxygen Therapy: Patient Spontanous Breathing  Post-op Pain: mild  Post-op Assessment: Post-op Vital signs reviewed, Patient's Cardiovascular Status Stable, Respiratory Function Stable, Patent Airway and No signs of Nausea or vomiting  Last Vitals:  Filed Vitals:   08/18/14 1415  BP:   Pulse: 64  Temp:   Resp: 13    Post-op Vital Signs: stable   Complications: No apparent anesthesia complications

## 2014-08-18 NOTE — Transfer of Care (Signed)
Immediate Anesthesia Transfer of Care Note  Patient: Kayla Austin  Procedure(s) Performed: Procedure(s) (LRB): XI ROBOT ASSISTED LAPAROSCOPIC LOW ANTERIOR RESECTION (N/A)  Patient Location: PACU  Anesthesia Type: General  Level of Consciousness: sedated, patient cooperative and responds to stimulation  Airway & Oxygen Therapy: Patient Spontanous Breathing and Patient connected to face mask oxgen  Post-op Assessment: Report given to PACU RN and Post -op Vital signs reviewed and stable  Post vital signs: Reviewed and stable  Complications: No apparent anesthesia complications

## 2014-08-18 NOTE — Interval H&P Note (Signed)
History and Physical Interval Note:  08/18/2014 7:17 AM  Kayla Austin  has presented today for surgery, with the diagnosis of colovaginal fistulas  The various methods of treatment have been discussed with the patient and family. After consideration of risks, benefits and other options for treatment, the patient has consented to  Procedure(s): XI ROBOT ASSISTED LAPAROSCOPIC PARTIAL COLECTOMY (N/A) as a surgical intervention .  The patient's history has been reviewed, patient examined, no change in status, stable for surgery.  I have reviewed the patient's chart and labs.  Questions were answered to the patient's satisfaction.     Vanita PandaAlicia C Jakhi Dishman, MD  Colorectal and General Surgery [facility]

## 2014-08-18 NOTE — Op Note (Signed)
08/18/2014  1:36 PM  PATIENT:  Alphia MohArlean Nuzzo  80 y.o. female  Patient Care Team: Rometta EmeryMohammad L Garba, MD as PCP - General (Internal Medicine)  PRE-OPERATIVE DIAGNOSIS:  colovaginal fistula  POST-OPERATIVE DIAGNOSIS:  colovaginal fistula  PROCEDURE:  XI ROBOT ASSISTED LOW ANTERIOR RESECTION  SURGEON:  Surgeon(s): Axel FillerArmando Ramirez, MD Romie LeveeAlicia Meet Weathington, MD  ASSISTANT: Derrell Lollingamirez   ANESTHESIA:   local and general  EBL: 300ml Total I/O In: 4000 [I.V.:4000] Out: 600 [Urine:300; Blood:300]  Delay start of Pharmacological VTE agent (>24hrs) due to surgical blood loss or risk of bleeding:  no  DRAINS: (13F) Jackson-Pratt drain(s) with closed bulb suction in the pelvis   SPECIMEN:  Source of Specimen:  Rectosigmoid  DISPOSITION OF SPECIMEN:  PATHOLOGY  COUNTS:  YES  PLAN OF CARE: Admit to inpatient   PATIENT DISPOSITION:  PACU - hemodynamically stable.  INDICATION:    This is a 80 y.o. F with a colovaginal fistula and severe diverticular disease.   I recommended segmental resection:  The anatomy & physiology of the digestive tract was discussed.  The pathophysiology was discussed.  Natural history risks without surgery was discussed.   I worked to give an overview of the disease and the frequent need to have multispecialty involvement.  I feel the risks of no intervention will lead to serious problems that outweigh the operative risks; therefore, I recommended a partial colectomy to remove the pathology.  Laparoscopic & open techniques were discussed.   Risks such as bleeding, infection, abscess, leak, reoperation, possible ostomy, hernia, heart attack, death, and other risks were discussed.  I noted a good likelihood this will help address the problem.   Goals of post-operative recovery were discussed as well.    The patient expressed understanding & wished to proceed with surgery.  OR FINDINGS:   Patient had a loop of sigmoid adherent to left pelvic wall and vaginal cuff.  No  obvious metastatic disease on visceral parietal peritoneum or liver.  The anastomosis rests 8 cm from the anal verge by rigid proctoscopy.  DESCRIPTION:   Informed consent was confirmed.  The patient underwent general anaesthesia without difficulty.  The patient was positioned appropriately.  VTE prevention in place.  The patient's abdomen was clipped, prepped, & draped in a sterile fashion.  Surgical timeout confirmed our plan.  The patient was positioned in reverse Trendelenburg.  Abdominal entry was gained using Veress needle.  Entry was clean.  I induced carbon dioxide insufflation.  Camera inspection revealed no injury.  Extra ports were carefully placed under direct laparoscopic visualization.  There was a large amount of adhesions to her upper abdomen that were taken down to place the ports safely.    The robot was then docked to patient's left side. I reflected the greater omentum and the upper abdomen the small bowel in the upper abdomen. I then mobilized the sigmoid out of the pelvis and off of the left pelvic sidewall using sharp and blunt dissection.  The ureter was identified and preserved.  I scored the base of peritoneum of the right side of the mesentery of the left colon and down to the peritoneal reflection of the mid rectum.   I elevated the sigmoid mesentery and enetered into the retro-mesenteric plane. We were able to identify the left ureter and gonadal vessels. We kept those posterior within the retroperitoneum and elevated the left colon mesentery off that. I did isolated IMA pedicle but did not ligate it yet.  I continued distally and got into  the avascular plane posterior to the mesorectum. This allowed me to help mobilize the rectum as well by freeing the mesorectum off the sacrum.  I mobilized the peritoneal coverings towards the peritoneal reflection on both the right and left sides of the rectum.  I could see the right and left ureters and stayed away from them.    I  skeletonized the inferior mesenteric artery pedicle.  I went down to its takeoff from the aorta.  After confirming the left ureter was out of the way, I went ahead and ligated the inferior mesenteric artery pedicle with the vessel sealer ~2cm above its takeoff from the aorta.  We ensured hemostasis. I skeletonized the mesorectum at the junction at the proximal rectum using blunt dissection & the vessel sealer.  This was transected using a blue load robotic stapler.  I mobilized the left colon in a lateral to medial fashion off the line of Toldt up towards the splenic flexure to ensure good mobilization of the left colon to reach into the pelvis. I divided the mesentery up to a portion of normal appearing descending colon.  I evaluated perfusion using fire fly.  There was good perfusion noted in the rectal stump and descending colon.  I evaluated for any bladder or ureter injuries using methylene blue.  There were no signs of leak.  I made a pfannensteal incision in the lower abdomen and placed the alexis wound protector.  The colon was brought out of the abdomen and a purse string device was placed on the previously marked descending colon.  A 2-0 prolene suture was used to create the purse string.  A 29mm EEA anvil was placed and the purse string was tied.  I completed the anastomosis laparoscopically.  There was no leak when tested under water with insufflation.  The abdomen was irrigated with normal saline. Hemostasis was good.  A 19 F blake drain was placed in the pelvis. This was brought out the RLQ and secured with a 2-0 Nylon suture.  We switched to clean instruments, gowns, gloves and drapes.  The 12mm port site was closed with a 0 Vicryl suture.  The peritoneum was closed using a running 2-0 Vicryl sutures, and the fascia was closed using #1 PDS sutures.  The subcutaneous tissues were closed using 2-0 Vicryl stitches and the skin of all incisions was closed with staples.  A sterile dressing was applied.   All counts were correct per OR staff.  The patient was awakened from anesthesia and sent to the PACU in stable condition.

## 2014-08-18 NOTE — Progress Notes (Signed)
Medication Related Issue  80 yo female with colovaginal fistula s/p low anterior resection 10/23. Dr. Maisie Fushomas ordered Lovenox to start 10/24 and also resumed patient's home apixaban. Spoke with [facility] MD on-call (Dr. Carolynne Edouardoth) to clarify. Since patient will unlikely be able to take PO, proceed with Lovenox 40mg  q24h for now. Defer resumption of apixaban to primary surgeon.   Kayla Austin, PharmD, BCPS Pharmacy: [phone] 08/18/2014 5:16 PM

## 2014-08-19 LAB — CBC
HEMATOCRIT: 24.5 % — AB (ref 36.0–46.0)
Hemoglobin: 8.4 g/dL — ABNORMAL LOW (ref 12.0–15.0)
MCH: 26.8 pg (ref 26.0–34.0)
MCHC: 34.3 g/dL (ref 30.0–36.0)
MCV: 78.3 fL (ref 78.0–100.0)
Platelets: 199 10*3/uL (ref 150–400)
RBC: 3.13 MIL/uL — ABNORMAL LOW (ref 3.87–5.11)
RDW: 15.4 % (ref 11.5–15.5)
WBC: 7.7 10*3/uL (ref 4.0–10.5)

## 2014-08-19 LAB — BASIC METABOLIC PANEL
Anion gap: 12 (ref 5–15)
BUN: 21 mg/dL (ref 6–23)
CALCIUM: 7.9 mg/dL — AB (ref 8.4–10.5)
CO2: 22 meq/L (ref 19–32)
CREATININE: 1.31 mg/dL — AB (ref 0.50–1.10)
Chloride: 99 mEq/L (ref 96–112)
GFR calc Af Amer: 44 mL/min — ABNORMAL LOW (ref >90)
GFR, EST NON AFRICAN AMERICAN: 38 mL/min — AB (ref >90)
GLUCOSE: 120 mg/dL — AB (ref 70–99)
Potassium: 3.9 mEq/L (ref 3.7–5.3)
Sodium: 133 mEq/L — ABNORMAL LOW (ref 137–147)

## 2014-08-19 NOTE — Evaluation (Signed)
Physical Therapy Evaluation Patient Details Name: Kayla Austin MRN: [id number] DOB: 12/19/1931 Today's Date: 08/19/2014   History of Present Illness  Pt is s/p lap resection due to colovaginal fistula  Clinical Impression  Patient presents with decreased independence with mobility and will benefit from skilled PT in the acute setting to allow d/c home with family assist (plans to have 24 hr help first few days home at least.)  Will need HHPT, but does not want a walker.  Will try with cane next session to ensure safest assistive device prior to d/c.    Follow Up Recommendations Home health PT;Supervision/Assistance - 24 hour    Equipment Recommendations  None recommended by PT    Recommendations for Other Services       Precautions / Restrictions Precautions Precautions: Fall Restrictions Weight Bearing Restrictions: No      Mobility  Bed Mobility Overal bed mobility: Modified Independent Bed Mobility: Sidelying to Sit   Sidelying to sit: Supervision       General bed mobility comments: cues for technique  Transfers Overall transfer level: Needs assistance Equipment used: Rolling walker (2 wheeled) Transfers: Sit to/from Stand Sit to Stand: Supervision         General transfer comment: for safety with IV, foley  Ambulation/Gait Ambulation/Gait assistance: Supervision;Min guard Ambulation Distance (Feet): 225 Feet Assistive device: Rolling walker (2 wheeled) Gait Pattern/deviations: Step-through pattern;Trunk flexed     General Gait Details: demonstrates ability to walk without walker in room, states just feels weak  Stairs            Wheelchair Mobility    Modified Rankin (Stroke Patients Only)       Balance Overall balance assessment: Needs assistance         Standing balance support: No upper extremity supported Standing balance-Leahy Scale: Fair Standing balance comment: able to stand unsupported; but feels weak, needs UE assist for  ambulation                             Pertinent Vitals/Pain Pain Assessment: 0-10 Pain Score: 4  Pain Location: abdomen Pain Intervention(s): Monitored during session    Home Living Family/patient expects to be discharged to:: Private residence Living Arrangements: Alone Available Help at Discharge: Family;Available 24 hours/day Type of Home: Apartment Home Access: Stairs to enter Entrance Stairs-Rails: None Entrance Stairs-Number of Steps: 2 Home Layout: One level Home Equipment: Cane - single point      Prior Function Level of Independence: Independent               Hand Dominance        Extremity/Trunk Assessment   Upper Extremity Assessment: Overall WFL for tasks assessed           Lower Extremity Assessment: Generalized weakness         Communication   Communication: No difficulties  Cognition Arousal/Alertness: Awake/alert Behavior During Therapy: WFL for tasks assessed/performed Overall Cognitive Status: Within Functional Limits for tasks assessed Area of Impairment: Safety/judgement;Attention   Current Attention Level: Sustained           General Comments: pt is a retired LawyerCNA; 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  Decreased attention to trip hazards in room    General Comments      Exercises        Assessment/Plan    PT Assessment Patient needs continued PT services  PT Diagnosis Generalized  weakness   PT Problem List Decreased strength;Decreased balance;Decreased mobility;Decreased activity tolerance;Decreased safety awareness;Decreased knowledge of use of DME  PT Treatment Interventions DME instruction;Gait training;Stair training;Functional mobility training;Therapeutic activities;Patient/family education;Therapeutic exercise   PT Goals (Current goals can be found in the Care Plan section) Acute Rehab PT Goals Patient Stated Goal: home PT Goal  Formulation: With patient Time For Goal Achievement: 08/26/14 Potential to Achieve Goals: Good    Frequency Min 3X/week   Barriers to discharge        Co-evaluation               End of Session Equipment Utilized During Treatment: Gait belt Activity Tolerance: Other (comment) (limited by nausea) Patient left: in bed;with call bell/phone within reach           Time: [id number] PT Time Calculation (min): 20 min   Charges:   PT Evaluation $Initial PT Evaluation Tier I: 1 Procedure PT Treatments $Gait Training: 8-22 mins   PT G Codes:          Austin,Kayla 08/19/2014, 4:45 PM Kayla Austin, PT [phone] 08/19/2014

## 2014-08-19 NOTE — Plan of Care (Signed)
Problem: Phase I Progression Outcomes Goal: OOB as tolerated unless otherwise ordered Outcome: Progressing PT assisting with mobility.

## 2014-08-19 NOTE — Progress Notes (Addendum)
CARE MANAGEMENT NOTE 08/20/2014  Patient:  Kayla Austin,Kayla Austin   Account Number:  [id number]  Date Initiated:  08/20/2014  Documentation initiated by:  [facility],Jadore Veals  Subjective/Objective Assessment:   XI ROBOT ASSISTED LOW ANTERIOR RESECTION     Action/Plan:   lives at home with dtr   Anticipated DC Date:  08/21/2014   Anticipated DC Plan:  HOME W HOME HEALTH SERVICES      DC Planning Services  CM consult      [facility]  [facility]ALTH   Choice offered to / List presented to:  C-1 Patient        HH arranged  HH - 11 Patient Refused      Status of service:  In process, will continue to follow Medicare Important Message given?   (If response is "NO", the following Medicare IM given date fields will be blank) Date Medicare IM given:   Medicare IM given by:   Date Additional Medicare IM given:   Additional Medicare IM given by:    Discharge Disposition:    Per UR Regulation:    If discussed at Long Length of Stay Meetings, dates discussed:    Comments:  08/19/2014 5:17 PM  NCM spoke to pt and states she does not feel she needs HH PT. States her dtr and brother will be at home to assist with her care. She is requesting a RW for home. Waiting final recommendations for home. Isidoro DonningAlesia Rocklyn Mayberry RN CCM Case Mgmt phone [phone]

## 2014-08-19 NOTE — Evaluation (Signed)
Occupational Therapy Evaluation Patient Details Name: Kayla Austin MRN: [id number] DOB: 06/19/1932 Today's Date: 08/19/2014    History of Present Illness Pt is s/p lap resection due to colovaginal fistula   Clinical Impression   This 80 year old female underwent the above surgery.  She will benefit from skilled OT to increase independence and safety with adls.  Pt was independent prior to admission.  Goals are set for supervision level overall, except tub transfer which is min guard. Will continue to assess for tub DME    Follow Up Recommendations  Supervision/Assistance - 24 hour;Home health OT    Equipment Recommendations   (to be further assessed, ? shower seat)    Recommendations for Other Services       Precautions / Restrictions Precautions Precautions: Fall Restrictions Weight Bearing Restrictions: No      Mobility Bed Mobility Overal bed mobility: Needs Assistance Bed Mobility: Sidelying to Sit   Sidelying to sit: Supervision       General bed mobility comments: cues for technique  Transfers Overall transfer level: Needs assistance Equipment used: Rolling walker (2 wheeled) Transfers: Sit to/from Stand Sit to Stand: Min guard         General transfer comment: for safety    Balance                                            ADL Overall ADL's : Needs assistance/impaired     Grooming: Set up;Sitting   Upper Body Bathing: Set up;Sitting   Lower Body Bathing: Minimal assistance;Sit to/from stand   Upper Body Dressing : Set up;Sitting   Lower Body Dressing: Minimal assistance;Sit to/from stand   Toilet Transfer: Minimal assistance;Comfort height toilet;Ambulation   Toileting- Clothing Manipulation and Hygiene: Minimal assistance;Sit to/from stand         General ADL Comments: ambulated to bathroom.  Initially used RW.  Pt does not use anything at home.  Pt wanted to try to walk back without this.  Gave her hand held  assist and she held onto IV pole.  She is being transferred to 5th floor; repositioned in w/c     Vision                     Perception     Praxis      Pertinent Vitals/Pain Pain Assessment: No/denies pain     Hand Dominance     Extremity/Trunk Assessment Upper Extremity Assessment Upper Extremity Assessment: Overall WFL for tasks assessed           Communication Communication Communication: No difficulties   Cognition Arousal/Alertness: Awake/alert Behavior During Therapy: WFL for tasks assessed/performed Overall Cognitive Status: Impaired/Different from baseline Area of Impairment: Safety/judgement;Attention   Current Attention Level: Sustained           General Comments: pt is a retired LawyerCNA; 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  Decreased attention to trip hazards in room   General Comments       Exercises       Shoulder Instructions      Home Living Family/patient expects to be discharged to:: Private residence Living Arrangements:  (states daughter and granddaughter live with her:  ? 24/7)  Asked pt twice about family availability: she was very distracted in room and did not answer   Type of Home:  Apartment Home Access: Stairs to enter Entrance Stairs-Number of Steps: 2   Home Layout: One level     Bathroom Shower/Tub: Chief Strategy OfficerTub/shower unit   Bathroom Toilet: Standard                Prior Functioning/Environment Level of Independence: Independent             OT Diagnosis: Generalized weakness   OT Problem List: Decreased strength;Decreased activity tolerance;Decreased cognition;Decreased knowledge of use of DME or AE   OT Treatment/Interventions: Self-care/ADL training;DME and/or AE instruction;Patient/family education;Therapeutic activities    OT Goals(Current goals can be found in the care plan section) Acute Rehab OT Goals Patient Stated Goal: home OT Goal  Formulation: With patient Time For Goal Achievement: 09/02/14 Potential to Achieve Goals: Good ADL Goals Pt Will Transfer to Toilet: with supervision;ambulating;regular height toilet Pt Will Perform Tub/Shower Transfer: Tub transfer;ambulating;with min guard assist Additional ADL Goal #1: pt will gather adl supplies and complete adl at supervision level  OT Frequency: Min 2X/week   Barriers to D/C:            Co-evaluation              End of Session    Activity Tolerance: Patient tolerated treatment well Patient left: in chair;with call bell/phone within reach (w/c)   Time: [id number] OT Time Calculation (min): 33 min Charges:  OT General Charges $OT Visit: 1 Procedure OT Evaluation $Initial OT Evaluation Tier I: 1 Procedure OT Treatments $Self Care/Home Management : 8-22 mins $Therapeutic Activity: 8-22 mins G-Codes:    Kayla Austin 08/19/2014, 1:26 PM  Marica OtterMaryellen Bud Kaeser, OTR/L [phone] 08/19/2014

## 2014-08-19 NOTE — Progress Notes (Signed)
1 Day Post-Op  Subjective: Pt ding well today.  Minimal pain this AM Working on her IS  Objective: Vital signs in last 24 hours: Temp:  [95.7 F (35.4 C)-98.6 F (37 C)] 98.6 F (37 C) (10/24 0400) Pulse Rate:  [54-90] 72 (10/24 0600) Resp:  [11-24] 20 (10/24 0600) BP: (124-177)/(44-140) 149/49 mmHg (10/24 0600) SpO2:  [98 %-100 %] 100 % (10/24 0600) Arterial Line BP: (130-139)/(49-56) 139/52 mmHg (10/23 1430) Weight:  [188 lb 7.9 oz (85.5 kg)-191 lb 2.2 oz (86.7 kg)] 188 lb 7.9 oz (85.5 kg) (10/24 0600) Last BM Date: 08/17/14  Intake/Output from previous day: 10/23 0701 - 10/24 0700 In: 6880 [P.O.:180; I.V.:6650; IV Piggyback:50] Out: 1870 [Urine:1110; Drains:460; Blood:300] Intake/Output this shift:    General appearance: alert and cooperative Resp: clear to auscultation bilaterally Cardio: regular rate and rhythm, S1, S2 normal, no murmur, click, rub or gallop GI: soft, approp ttp, ND, wound cdi JP SS  Lab Results:   Recent Labs  08/19/14 0357  WBC 7.7  HGB 8.4*  HCT 24.5*  PLT 199   BMET  Recent Labs  08/19/14 0357  NA 133*  K 3.9  CL 99  CO2 22  GLUCOSE 120*  BUN 21  CREATININE 1.31*  CALCIUM 7.9*    Anti-infectives: Anti-infectives   Start     Dose/Rate Route Frequency Ordered Stop   08/18/14 1800  clindamycin (CLEOCIN) IVPB 900 mg     900 mg 100 mL/hr over 30 Minutes Intravenous 3 times per day 08/18/14 1652 08/18/14 1800   08/18/14 0551  clindamycin (CLEOCIN) IVPB 900 mg     900 mg 100 mL/hr over 30 Minutes Intravenous 60 min pre-op 08/18/14 0551 08/18/14 0742   08/18/14 0551  gentamicin (GARAMYCIN) 410 mg in dextrose 5 % 100 mL IVPB     5 mg/kg  82.6 kg 110.3 mL/hr over 60 Minutes Intravenous 60 min pre-op 08/18/14 0551 08/18/14 0827      Assessment/Plan: s/p Procedure(s): XI ROBOT ASSISTED LAPAROSCOPIC LOW ANTERIOR RESECTION (N/A) To floor Hold anticoag Encourage IS OOBTC and amb with asst   LOS: 1 day    Marigene Ehlersamirez Jr.,  Jed LimerickArmando 08/19/2014

## 2014-08-20 LAB — CBC
HCT: 26.7 % — ABNORMAL LOW (ref 36.0–46.0)
Hemoglobin: 8.9 g/dL — ABNORMAL LOW (ref 12.0–15.0)
MCH: 26.7 pg (ref 26.0–34.0)
MCHC: 33.3 g/dL (ref 30.0–36.0)
MCV: 80.2 fL (ref 78.0–100.0)
PLATELETS: 233 10*3/uL (ref 150–400)
RBC: 3.33 MIL/uL — ABNORMAL LOW (ref 3.87–5.11)
RDW: 16 % — ABNORMAL HIGH (ref 11.5–15.5)
WBC: 8.8 10*3/uL (ref 4.0–10.5)

## 2014-08-20 LAB — BASIC METABOLIC PANEL
Anion gap: 12 (ref 5–15)
BUN: 15 mg/dL (ref 6–23)
CO2: 24 mEq/L (ref 19–32)
CREATININE: 1.21 mg/dL — AB (ref 0.50–1.10)
Calcium: 8.4 mg/dL (ref 8.4–10.5)
Chloride: 105 mEq/L (ref 96–112)
GFR, EST AFRICAN AMERICAN: 48 mL/min — AB (ref >90)
GFR, EST NON AFRICAN AMERICAN: 41 mL/min — AB (ref >90)
Glucose, Bld: 101 mg/dL — ABNORMAL HIGH (ref 70–99)
Potassium: 4.2 mEq/L (ref 3.7–5.3)
Sodium: 141 mEq/L (ref 137–147)

## 2014-08-20 MED ORDER — ALUM & MAG HYDROXIDE-SIMETH 200-200-20 MG/5ML PO SUSP
30.0000 mL | ORAL | Status: DC | PRN
  Administered 2014-08-20: 30 mL via ORAL
  Filled 2014-08-20: qty 30

## 2014-08-20 NOTE — Progress Notes (Signed)
Pharmacy Brief Note - Alvimopan (Entereg)  The standing order set for alvimopan (Entereg) now includes an automatic order to discontinue the drug after the patient has had a bowel movement.  The change was approved by the Pharmacy & Therapeutics Committee and the Medical Executive Committee.    This patient has had bowel movements documented by nursing.  Therefore, alvimopan has been discontinued.  If there are questions, please contact the pharmacy at [phone].  Thank you- Elie Goodyandy Jaleiah Asay, PharmD, BCPS Pager: [phone] 08/20/2014  5:10 PM

## 2014-08-20 NOTE — Progress Notes (Signed)
RE: Frequent bowel movements.  Contacted Dr. Carolynne Edouardoth regarding pt's frequent watery BMs throughout the day and decreased PO intake.  Pt's IVF decreased from 100 ml/hr to 50 this a.m..  MD said monitor to ensure regular urinary output continues.  No further action ordered at this time.

## 2014-08-20 NOTE — Progress Notes (Signed)
2 Days Post-Op  Subjective: No complaints. 4 bm's this am  Objective: Vital signs in last 24 hours: Temp:  [97.9 F (36.6 C)-98.8 F (37.1 C)] 98.8 F (37.1 C) (10/25 0613) Pulse Rate:  [71-118] 80 (10/25 0613) Resp:  [14-24] 18 (10/25 0613) BP: (110-159)/(41-82) 154/62 mmHg (10/25 0613) SpO2:  [82 %-100 %] 100 % (10/25 16100613) Last BM Date: 08/17/14  Intake/Output from previous day: 10/24 0701 - 10/25 0700 In: 1700 [I.V.:1700] Out: 3070 [Urine:2925; Drains:145] Intake/Output this shift:    Resp: clear to auscultation bilaterally Cardio: regular rate and rhythm GI: soft, mild tenderness. incisions ok. bs present  Lab Results:   Recent Labs  08/19/14 0357 08/20/14 0615  WBC 7.7 8.8  HGB 8.4* 8.9*  HCT 24.5* 26.7*  PLT 199 233   BMET  Recent Labs  08/19/14 0357  NA 133*  K 3.9  CL 99  CO2 22  GLUCOSE 120*  BUN 21  CREATININE 1.31*  CALCIUM 7.9*   PT/INR No results found for this basename: LABPROT, INR,  in the last 72 hours ABG No results found for this basename: PHART, PCO2, PO2, HCO3,  in the last 72 hours  Studies/Results: No results found.  Anti-infectives: Anti-infectives   Start     Dose/Rate Route Frequency Ordered Stop   08/18/14 1800  clindamycin (CLEOCIN) IVPB 900 mg     900 mg 100 mL/hr over 30 Minutes Intravenous 3 times per day 08/18/14 1652 08/18/14 1800   08/18/14 0551  clindamycin (CLEOCIN) IVPB 900 mg     900 mg 100 mL/hr over 30 Minutes Intravenous 60 min pre-op 08/18/14 0551 08/18/14 0742   08/18/14 0551  gentamicin (GARAMYCIN) 410 mg in dextrose 5 % 100 mL IVPB     5 mg/kg  82.6 kg 110.3 mL/hr over 60 Minutes Intravenous 60 min pre-op 08/18/14 0551 08/18/14 0827      Assessment/Plan: s/p Procedure(s): XI ROBOT ASSISTED LAPAROSCOPIC LOW ANTERIOR RESECTION (N/A) Advance diet. Start clears today and monitor Ambulate Continue foley for now  LOS: 2 days    TOTH III,PAUL S 08/20/2014

## 2014-08-21 LAB — BASIC METABOLIC PANEL
ANION GAP: 14 (ref 5–15)
BUN: 10 mg/dL (ref 6–23)
CALCIUM: 8.6 mg/dL (ref 8.4–10.5)
CO2: 22 mEq/L (ref 19–32)
Chloride: 102 mEq/L (ref 96–112)
Creatinine, Ser: 1.09 mg/dL (ref 0.50–1.10)
GFR calc non Af Amer: 47 mL/min — ABNORMAL LOW (ref >90)
GFR, EST AFRICAN AMERICAN: 54 mL/min — AB (ref >90)
GLUCOSE: 105 mg/dL — AB (ref 70–99)
Potassium: 3.8 mEq/L (ref 3.7–5.3)
SODIUM: 138 meq/L (ref 137–147)

## 2014-08-21 LAB — CBC
HCT: 27.9 % — ABNORMAL LOW (ref 36.0–46.0)
Hemoglobin: 9.3 g/dL — ABNORMAL LOW (ref 12.0–15.0)
MCH: 26.8 pg (ref 26.0–34.0)
MCHC: 33.3 g/dL (ref 30.0–36.0)
MCV: 80.4 fL (ref 78.0–100.0)
Platelets: 286 10*3/uL (ref 150–400)
RBC: 3.47 MIL/uL — ABNORMAL LOW (ref 3.87–5.11)
RDW: 15.9 % — ABNORMAL HIGH (ref 11.5–15.5)
WBC: 11.6 10*3/uL — AB (ref 4.0–10.5)

## 2014-08-21 NOTE — Addendum Note (Signed)
Addendum created 08/21/14 1121 by Orest DikesLaura J Kiwanna Spraker, CRNA   Modules edited: Anesthesia Events

## 2014-08-21 NOTE — Progress Notes (Signed)
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 Is progressing well with acute OT goals, very motivated and eager to stay active.  Prefers no A/D for amb. And did demonstrate good overall balance today in room during ADL tasks.  Follow Up Recommendations  Supervision/Assistance - 24 hour;Home health OT                 Precautions / Restrictions Precautions Precautions: Fall Precaution Comments: contact Restrictions Weight Bearing Restrictions: No       Mobility Bed Mobility Overal bed mobility: Modified Independent Bed Mobility: Supine to Sit   Sidelying to sit: Supervision       General bed mobility comments: Pt OOB in recliner   Transfers Overall transfer level: Needs assistance Equipment used: None Transfers: Sit to/from Stand Sit to Stand: Supervision;Min guard         General transfer comment: for safety with IV, foley    Balance                                   ADL Overall ADL's : Needs assistance/impaired     Grooming: Wash/dry hands;Wash/dry face;Min guard;Cueing for sequencing;Sitting;Standing Grooming Details (indicate cue type and reason): cues for safe walker placement while standing at the sink         Upper Body Dressing : Set up;Sitting Upper Body Dressing Details (indicate cue type and reason): able to don/doff gown with set up Lower Body Dressing: Min guard;Sit to/from stand;Sitting/lateral leans Lower Body Dressing Details (indicate cue type and reason): able to don/doff socks while sitting, and manage clothing while transitioning from sit/stand duirng toileting Toilet Transfer: Minimal assistance;Cueing for sequencing;Ambulation;Regular Social workerToilet   Toileting- Clothing Manipulation and Hygiene: Min guard;Sit to/from stand;Cueing for  sequencing;Cueing for safety   Tub/ Shower Transfer: Tub transfer;Minimal assistance;Ambulation Tub/Shower Transfer Details (indicate cue type and reason): pt. reports she just "steps in/out" and "has things she can hold onto" educated on not pulling or holding onto things that are not securly attached or can move as major fall risk. she verbalized understanding Functional mobility during ADLs: Min guard General ADL Comments: requested to ambulate without device "i don't have one at home", no LOB noted during amb. minimal to no furniture walking required.  required min a to clear est. tub height. educated on safety concerns of holding onto items that are not secure                                         Behavior During Therapy: WFL for tasks assessed/performed Overall Cognitive Status: Within Functional Limits for tasks assessed                                                  General Comments      Pertinent Vitals/ Pain       Pain Assessment: 0-10 Pain Score: 5  Pain Location: abdomen Pain Descriptors / Indicators: Aching Pain Intervention(s): Premedicated before session;Repositioned  Home Living  Frequency Min 2X/week     Progress Toward Goals  OT Goals(current goals can now be found in the care plan section)  Progress towards OT goals: Progressing toward goals     Plan Discharge plan remains appropriate                     End of Session Equipment Utilized During Treatment: Gait belt   Activity Tolerance Patient tolerated treatment well   Patient Left in chair;with call bell/phone within reach             Time: 1115-1140 OT Time Calculation (min): 25 min  Charges: OT General Charges $OT Visit: 1 Procedure OT Treatments $Self Care/Home Management : 23-37 mins  Robet LeuMorris, Caridad Silveira Lorraine, COTA/L 08/21/2014, 1:02 PM

## 2014-08-21 NOTE — Progress Notes (Signed)
3 Days Post-Op Robotic LAR  Subjective: Having multiple watery BM's, no blood.  UOP good.  No fevers.  Min abd pain  Objective: Vital signs in last 24 hours: Temp:  [98 F (36.7 C)-98.3 F (36.8 C)] 98 F (36.7 C) (10/26 0620) Pulse Rate:  [79-96] 80 (10/26 0620) Resp:  [16-18] 18 (10/26 0620) BP: (156-174)/(53-100) 160/58 mmHg (10/26 0620) SpO2:  [100 %] 100 % (10/26 0620) Last BM Date: 08/20/14  Intake/Output from previous day: 10/25 0701 - 10/26 0700 In: 1791.7 [P.O.:360; I.V.:1431.7] Out: 3530 [Urine:1550; Drains:580; Stool:1400] Intake/Output this shift:    Resp: clear to auscultation bilaterally Cardio: regular rate and rhythm GI: soft, mild tenderness. incisions ok. bs present  Lab Results:   Recent Labs  08/20/14 0615 08/21/14 0510  WBC 8.8 11.6*  HGB 8.9* 9.3*  HCT 26.7* 27.9*  PLT 233 286   BMET  Recent Labs  08/20/14 0615 08/21/14 0510  NA 141 138  K 4.2 3.8  CL 105 102  CO2 24 22  GLUCOSE 101* 105*  BUN 15 10  CREATININE 1.21* 1.09  CALCIUM 8.4 8.6   PT/INR No results found for this basename: LABPROT, INR,  in the last 72 hours ABG No results found for this basename: PHART, PCO2, PO2, HCO3,  in the last 72 hours  Studies/Results: No results found.  Anti-infectives: Anti-infectives   Start     Dose/Rate Route Frequency Ordered Stop   08/18/14 1800  clindamycin (CLEOCIN) IVPB 900 mg     900 mg 100 mL/hr over 30 Minutes Intravenous 3 times per day 08/18/14 1652 08/18/14 1800   08/18/14 0551  clindamycin (CLEOCIN) IVPB 900 mg     900 mg 100 mL/hr over 30 Minutes Intravenous 60 min pre-op 08/18/14 0551 08/18/14 0742   08/18/14 0551  gentamicin (GARAMYCIN) 410 mg in dextrose 5 % 100 mL IVPB     5 mg/kg  82.6 kg 110.3 mL/hr over 60 Minutes Intravenous 60 min pre-op 08/18/14 0551 08/18/14 0827      Assessment/Plan: s/p Procedure(s): XI ROBOT ASSISTED LAPAROSCOPIC LOW ANTERIOR RESECTION (N/A) Advance diet. Start soft diet today and  monitor Ambulate, PT/OT eval D/C foley today   LOS: 3 days    Paisely Brick C. 08/21/2014

## 2014-08-21 NOTE — Care Management Note (Signed)
    Page 1 of 2   08/21/2014     2:19:41 PM CARE MANAGEMENT NOTE 08/21/2014  Patient:  Kayla Austin,Kayla Austin   Account Number:  [id number]  Date Initiated:  08/20/2014  Documentation initiated by:  [facility] Center[facility]AVIS,ALESIA  Subjective/Objective Assessment:   XI ROBOT ASSISTED LOW ANTERIOR RESECTION     Action/Plan:   lives at home with dtr   Anticipated DC Date:  08/23/2014   Anticipated DC Plan:  HOME W HOME HEALTH SERVICES      DC Planning Services  CM consult      [facility]   Choice offered to / List presented to:  C-1 Patient   DME arranged  3-N-1  CANE      DME agency  [facility]     HH arranged  HH-2 PT      [facility]  [facility]   Status of service:  Completed, signed off Medicare Important Message given?  YES (If response is "NO", the following Medicare IM given date fields will be blank) Date Medicare IM given:  08/21/2014 Medicare IM given by:  [facility]Tekisha Darcey Date Additional Medicare IM given:   Additional Medicare IM given by:    Discharge Disposition:  HOME W HOME HEALTH SERVICES  Per UR Regulation:  Reviewed for med. necessity/level of care/duration of stay  If discussed at Long Length of Stay Meetings, dates discussed:    Comments:  08-21-14 Lorenda IshiharaSuzanne Shaniquia Brafford RN CM 1400 Spoke with patient at bedside after working with PT. [facility] Would benefit for PT and possibly RN to assist with medication management. Awaiting final orders.   08/19/2014 5:17 PM  NCM spoke to pt and states she does not feel she needs HH PT. States her dtr and brother will be at home to assist with her care. She is requesting a RW for home. Waiting final recommendations for home. Isidoro DonningAlesia Shavis RN CCM Case Mgmt phone [phone]

## 2014-08-21 NOTE — Progress Notes (Signed)
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  Looses stools have eased off.  Amb in hallway using a RW and a single point cane.  Pt much preferred the cane.  Progressing well.   Follow Up Recommendations  Supervision/Assistance - 24 hour (plans to have granddaughter help)  Pt declining HH services   Equipment Recommendations  Gilmer MorCane (reported to RN to write order)    Recommendations for Other Services       Precautions / Restrictions Precautions Precautions: Fall Precaution Comments: contact Restrictions Weight Bearing Restrictions: No    Mobility  Bed Mobility               General bed mobility comments: Pt OOB in recliner   Transfers Overall transfer level: Needs assistance Equipment used: None Transfers: Sit to/from Stand Sit to Stand: Supervision;Min guard         General transfer comment: for safety with IV, foley  Ambulation/Gait Ambulation/Gait assistance: Supervision;Min guard Ambulation Distance (Feet): 250 Feet Assistive device: Rolling walker (2 wheeled);Straight cane Gait Pattern/deviations: Step-through pattern Gait velocity: WFL   General Gait Details: amb first 1/2 with RW then second half with cane.  Pt much prefered the cane and demonstrated a good alternating safe gait.   Stairs Stairs: Yes Stairs assistance: Min guard Stair Management: With cane;No rails;Step to pattern;Forwards Number of Stairs: 2 General stair comments: <25% VC's on safety and proper tech.  Pt performed well.    Wheelchair Mobility    Modified Rankin (Stroke Patients Only)       Balance                                    Cognition                            Exercises      General Comments        Pertinent  Vitals/Pain Pain Assessment: No/denies pain    Home Living                      Prior Function            PT Goals (current goals can now be found in the care plan section) Progress towards PT goals: Progressing toward goals    Frequency  Min 3X/week    PT Plan      Co-evaluation             End of Session Equipment Utilized During Treatment: Gait belt Activity Tolerance: Patient tolerated treatment well Patient left: in chair;with call bell/phone within reach     Time: 1203-1231 PT Time Calculation (min): 28 min  Charges:  $Gait Training: 23-37 mins                    G Codes:      Felecia ShellingLori Danyal Adorno  PTA [facility]  Acute  Rehab Pager      [phone]

## 2014-08-22 LAB — BASIC METABOLIC PANEL
Anion gap: 12 (ref 5–15)
BUN: 6 mg/dL (ref 6–23)
CHLORIDE: 101 meq/L (ref 96–112)
CO2: 23 mEq/L (ref 19–32)
CREATININE: 1.03 mg/dL (ref 0.50–1.10)
Calcium: 8.5 mg/dL (ref 8.4–10.5)
GFR calc Af Amer: 58 mL/min — ABNORMAL LOW (ref >90)
GFR calc non Af Amer: 50 mL/min — ABNORMAL LOW (ref >90)
Glucose, Bld: 114 mg/dL — ABNORMAL HIGH (ref 70–99)
Potassium: 3.4 mEq/L — ABNORMAL LOW (ref 3.7–5.3)
Sodium: 136 mEq/L — ABNORMAL LOW (ref 137–147)

## 2014-08-22 LAB — PROTIME-INR
INR: 1.08 (ref 0.00–1.49)
Prothrombin Time: 14.1 seconds (ref 11.6–15.2)

## 2014-08-22 LAB — CBC
HCT: 26 % — ABNORMAL LOW (ref 36.0–46.0)
HEMOGLOBIN: 8.8 g/dL — AB (ref 12.0–15.0)
MCH: 27.4 pg (ref 26.0–34.0)
MCHC: 33.8 g/dL (ref 30.0–36.0)
MCV: 81 fL (ref 78.0–100.0)
Platelets: 234 10*3/uL (ref 150–400)
RBC: 3.21 MIL/uL — ABNORMAL LOW (ref 3.87–5.11)
RDW: 16 % — AB (ref 11.5–15.5)
WBC: 8 10*3/uL (ref 4.0–10.5)

## 2014-08-22 LAB — APTT: APTT: 26 s (ref 24–37)

## 2014-08-22 LAB — HEPARIN LEVEL (UNFRACTIONATED): HEPARIN UNFRACTIONATED: 0.7 [IU]/mL (ref 0.30–0.70)

## 2014-08-22 MED ORDER — HEPARIN BOLUS VIA INFUSION
3000.0000 [IU] | Freq: Once | INTRAVENOUS | Status: DC
  Filled 2014-08-22: qty 3000

## 2014-08-22 MED ORDER — HEPARIN (PORCINE) IN NACL 100-0.45 UNIT/ML-% IJ SOLN
950.0000 [IU]/h | INTRAMUSCULAR | Status: DC
  Administered 2014-08-23 – 2014-08-24 (×2): 950 [IU]/h via INTRAVENOUS
  Filled 2014-08-22 (×2): qty 250

## 2014-08-22 MED ORDER — HEPARIN BOLUS VIA INFUSION
3500.0000 [IU] | Freq: Once | INTRAVENOUS | Status: AC
  Administered 2014-08-22: 3500 [IU] via INTRAVENOUS
  Filled 2014-08-22: qty 3500

## 2014-08-22 MED ORDER — HEPARIN (PORCINE) IN NACL 100-0.45 UNIT/ML-% IJ SOLN
1000.0000 [IU]/h | INTRAMUSCULAR | Status: DC
  Administered 2014-08-22: 1000 [IU]/h via INTRAVENOUS
  Filled 2014-08-22: qty 250

## 2014-08-22 NOTE — Progress Notes (Signed)
ANTICOAGULATION CONSULT NOTE - Initial Consult  Pharmacy Consult for heparin infusion Indication: atrial fibrillation  Allergies  Allergen Reactions  . Ampicillin Rash    Patient Measurements: Height: 5\' 3"  (160 cm) Weight: 188 lb 7.9 oz (85.5 kg) IBW/kg (Calculated) : 52.4 Heparin Dosing Weight: 71.5kg  Vital Signs: Temp: 99 F (37.2 C) (10/27 0600) Temp Source: Oral (10/27 0600) BP: 194/80 mmHg (10/27 0600) Pulse Rate: 84 (10/27 0600)  Labs:  Recent Labs  08/20/14 0615 08/21/14 0510 08/22/14 0502  HGB 8.9* 9.3* 8.8*  HCT 26.7* 27.9* 26.0*  PLT 233 286 234  CREATININE 1.21* 1.09 1.03    Estimated Creatinine Clearance: 45.9 ml/min (by C-G formula based on Cr of 1.03).   Medical History: Past Medical History  Diagnosis Date  . Hypertension   . GERD (gastroesophageal reflux disease)   . Arthritis   . Coronary artery disease   . Hyperlipemia   . Shortness of breath     on exertion  . History of colon polyps   . Dysrhythmia     a fib  . Heart murmur     hx of   . Bronchitis     2009  . Chronic kidney disease     stage 3    Assessment: 89 yoF POD4 s/p robot assisted laproscopic low anterior resection. PMH includes atrial fibrillation, on Apixaban at home. Patient has been anticoagulated with prophylactic Lovenox dosing until today. Last dose of Lovenox 10/26 0740.  PTT: ordered INR: ordered CBC: Hgb low but stable, Pltc WNL  Significant events: 10/24: pt placed on lovenox post surgery 10/27: lovenox switched to heparin infusion  Goal of Therapy:  Heparin level 0.3-0.7 units/ml Monitor platelets by anticoagulation protocol: Yes   Plan:  Give Heparin bolus of 3500 units IV x1 Start Heparin infusion at 1000 units/hr Check heparin level 8 hours after start of infusion F/u baseline PTT, INR CBC daily  Loma BostonLaura Charlcie Prisco, PharmD Pager: [phone] 08/22/2014 8:16 AM

## 2014-08-22 NOTE — Progress Notes (Signed)
PT Cancellation Note  Patient Details Name: Kayla Austin MRN: [id number] DOB: 06/08/1932   Cancelled Treatment:    Reason Eval/Treat Not Completed: Fatigue/lethargy limiting ability to participate  Pt declines d/t  "just not feeling well" today; Pt reports to PT at this time that she is planning to go to rehab to so she may return to independence prior to return home; OT and PT recommending 24hr supervision d/t safety concerns-so pt should  benefit from SNF level therapies; Will continue to follow in acute setting;   Drucilla Chaletara Eliyahu Bille, PT Pager: [phone] 08/22/2014    Drucilla ChaletWILLIAMS,[name] 08/22/2014, 12:31 PM

## 2014-08-22 NOTE — Progress Notes (Addendum)
4 Days Post-Op Robotic LAR  Subjective: Less BM's yesterday, no blood.  UOP adequate.  No fevers.  Had more abd pain last night with one episode of vomiting  Objective: Vital signs in last 24 hours: Temp:  [99 F (37.2 C)-99.4 F (37.4 C)] 99 F (37.2 C) (10/27 0600) Pulse Rate:  [84-88] 84 (10/27 0600) Resp:  [16] 16 (10/27 0600) BP: (172-194)/(66-80) 194/80 mmHg (10/27 0600) SpO2:  [99 %-100 %] 100 % (10/27 0600) Last BM Date: 08/20/14  Intake/Output from previous day: 10/26 0701 - 10/27 0700 In: 955.8 [P.O.:600; I.V.:355.8] Out: 885 [Urine:800; Drains:85] Intake/Output this shift:    Resp: clear to auscultation bilaterally Cardio: regular rate and rhythm GI: soft, mild tenderness. incisions ok. Little distended JP: SS  Lab Results:   Recent Labs  08/21/14 0510 08/22/14 0502  WBC 11.6* 8.0  HGB 9.3* 8.8*  HCT 27.9* 26.0*  PLT 286 234   BMET  Recent Labs  08/21/14 0510 08/22/14 0502  NA 138 136*  K 3.8 3.4*  CL 102 101  CO2 22 23  GLUCOSE 105* 114*  BUN 10 6  CREATININE 1.09 1.03  CALCIUM 8.6 8.5   PT/INR No results found for this basename: LABPROT, INR,  in the last 72 hours ABG No results found for this basename: PHART, PCO2, PO2, HCO3,  in the last 72 hours  Studies/Results: No results found.  Anti-infectives: Anti-infectives   Start     Dose/Rate Route Frequency Ordered Stop   08/18/14 1800  clindamycin (CLEOCIN) IVPB 900 mg     900 mg 100 mL/hr over 30 Minutes Intravenous 3 times per day 08/18/14 1652 08/18/14 1800   08/18/14 0551  clindamycin (CLEOCIN) IVPB 900 mg     900 mg 100 mL/hr over 30 Minutes Intravenous 60 min pre-op 08/18/14 0551 08/18/14 0742   08/18/14 0551  gentamicin (GARAMYCIN) 410 mg in dextrose 5 % 100 mL IVPB     5 mg/kg  82.6 kg 110.3 mL/hr over 60 Minutes Intravenous 60 min pre-op 08/18/14 0551 08/18/14 0827      Assessment/Plan: s/p Procedure(s): XI ROBOT ASSISTED LAPAROSCOPIC LOW ANTERIOR RESECTION  (N/A) Cont soft diet, encourage liquid intake mainly Ambulate, PT/OT eval Start hep gtt for anticoagulation Pt would like to go to SNF at d/c    LOS: 4 days    Treyvone Chelf C. 08/22/2014

## 2014-08-22 NOTE — Progress Notes (Addendum)
Clinical Social Work Department CLINICAL SOCIAL WORK PLACEMENT NOTE 08/22/2014  Patient:  Kayla Austin,Kayla Austin  Account Number:  [id number] Admit date:  08/18/2014  Clinical Social Worker:  Cori RazorJAMIE Yehuda Printup, LCSW  Date/time:  08/22/2014 01:37 PM  Clinical Social Work is seeking post-discharge placement for this patient at the following level of care:   SKILLED NURSING   (*CSW will update this form in Epic as items are completed)   08/22/2014  [facility]family provided with [facility] Work's list of facilities offering this level of care within the geographic area requested by the patient (or if unable, by the patient's family).  08/22/2014  Patient/family informed of their freedom to choose among providers that offer the needed level of care, that participate in Medicare, Medicaid or managed care program needed by the patient, have an available bed and are willing to accept the patient.  08/22/2014  Patient/family informed of [facility]' ownership interest in [facility], as well as of the fact that they are under no obligation to receive care at this facility.  PASARR submitted to EDS on 08/22/2014 PASARR number received on 08/22/2014  FL2 transmitted to all facilities in geographic area requested by pt/family on  08/22/2014 FL2 transmitted to all facilities within larger geographic area on   Patient informed that his/her managed care company has contracts with or will negotiate with  certain facilities, including the following:     Patient/family informed of bed offers received:  08/22/14 Patient chooses bed at  Physician recommends and patient chooses bed at    Patient to be transferred to  on   Patient to be transferred to facility by  Patient and family notified of transfer on  Name of family member notified:    The following physician request were entered in Epic:   Additional Comments:   Cori RazorJamie Shakita Keir LCSW [phone]

## 2014-08-22 NOTE — Progress Notes (Signed)
ANTICOAGULATION CONSULT NOTE - Initial Consult  Pharmacy Consult for heparin infusion Indication: atrial fibrillation  Assessment: [age] yoF POD4 s/p robot assisted laproscopic low anterior resection. PMH includes atrial fibrillation, on Apixaban at home. Patient has been anticoagulated with prophylactic Lovenox dosing until today. Last dose of Lovenox 10/26 0740.  Pharmacy requested to start heparin infusion.   First heparin level 0.7 with infusion at 1000 units/hr (following a bolus dose).  Heparin level therapeutic but at top of therapeutic range.  Baseline aPTT and INR WNL  CBC: Hgb low but stable, Pltc WNL  Significant events: 10/24: pt placed on lovenox post surgery 10/27: lovenox switched to heparin infusion  Goal of Therapy:  Heparin level 0.3-0.7 units/ml Monitor platelets by anticoagulation protocol: Yes   Plan:  1.  Reduce heparin infusion rate slightly to 950 units/hr to ensure levels do not exceed therapeutic range. 2.  Check heparin level 8 hours after rate adjustment. 3.  F/u transition back to Eliquis when patient tolerating PO diet without N/V.  Clance BollAmanda Lorana Maffeo, PharmD, BCPS Pager: [phone] 08/22/2014 7:00 PM

## 2014-08-22 NOTE — Progress Notes (Signed)
Clinical Social Work Department BRIEF PSYCHOSOCIAL ASSESSMENT 08/22/2014  Patient:  Kayla Austin, Kayla Austin     Account Number:  [id number]     Admit date:  08/18/2014  Clinical Social Worker:  Lacie Scotts  Date/Time:  08/22/2014 01:29 PM  Referred by:  Physician  Date Referred:  08/22/2014 Referred for  SNF Placement   Other Referral:   Interview type:   Other interview type:    PSYCHOSOCIAL DATA Living Status:  ALONE Admitted from facility:   Level of care:   Primary support name:  Wardell Heath - [phone] Primary support relationship to patient:  FAMILY Degree of support available:   supportive [place]    CURRENT CONCERNS Current Concerns  Post-Acute Placement   Other Concerns:    SOCIAL WORK ASSESSMENT / PLAN Pt is a 80 yr old female living at home prior to hospitalization. CSW met with pt and spoke to her granddaughter, Lodema Pilot, to assist with d/c planning. PT has recommended ST Rehab following hospital d/c. Pt / family are in agreement with this plan. SNF  search has been initiated and bed offers are pending. CSW will continue to follow to assist with d/c planning needs.   Assessment/plan status:  Psychosocial Support/Ongoing Assessment of Needs Other assessment/ plan:   Information/referral to community resources:   Insurance coverage for SNF and ambulance transport reviewed.    PATIENT'S/FAMILY'S RESPONSE TO PLAN OF CARE: Pt feels a short time in rehab prior to returning home, alone, would be helpful. Family agrees with this plan. Granddaughter plans to tour facilities during her visit to [place] on [place].   Werner Lean LCSW [phone]

## 2014-08-22 NOTE — Progress Notes (Signed)
08/22/14 1237  PT - Assessment/Plan  PT Plan Discharge plan needs to be updated  Follow Up Recommendations Supervision/Assistance - 24 hour;SNF  Based on previous therapy notes, pt declined PT today;  Drucilla Chaletara Terryn Redner, PT Pager: [phone] 08/22/2014

## 2014-08-23 ENCOUNTER — Telehealth (INDEPENDENT_AMBULATORY_CARE_PROVIDER_SITE_OTHER): Payer: Self-pay | Admitting: General Surgery

## 2014-08-23 LAB — BASIC METABOLIC PANEL
Anion gap: 13 (ref 5–15)
BUN: 5 mg/dL — AB (ref 6–23)
CO2: 22 mEq/L (ref 19–32)
Calcium: 8.3 mg/dL — ABNORMAL LOW (ref 8.4–10.5)
Chloride: 101 mEq/L (ref 96–112)
Creatinine, Ser: 1.01 mg/dL (ref 0.50–1.10)
GFR calc Af Amer: 60 mL/min — ABNORMAL LOW (ref >90)
GFR, EST NON AFRICAN AMERICAN: 52 mL/min — AB (ref >90)
GLUCOSE: 113 mg/dL — AB (ref 70–99)
Potassium: 3.4 mEq/L — ABNORMAL LOW (ref 3.7–5.3)
Sodium: 136 mEq/L — ABNORMAL LOW (ref 137–147)

## 2014-08-23 LAB — CBC
HEMATOCRIT: 26.5 % — AB (ref 36.0–46.0)
HEMOGLOBIN: 8.8 g/dL — AB (ref 12.0–15.0)
MCH: 27 pg (ref 26.0–34.0)
MCHC: 33.2 g/dL (ref 30.0–36.0)
MCV: 81.3 fL (ref 78.0–100.0)
Platelets: 274 10*3/uL (ref 150–400)
RBC: 3.26 MIL/uL — AB (ref 3.87–5.11)
RDW: 16.1 % — ABNORMAL HIGH (ref 11.5–15.5)
WBC: 5.9 10*3/uL (ref 4.0–10.5)

## 2014-08-23 LAB — CLOSTRIDIUM DIFFICILE BY PCR: CDIFFPCR: NEGATIVE

## 2014-08-23 LAB — HEPARIN LEVEL (UNFRACTIONATED)
Heparin Unfractionated: 0.55 IU/mL (ref 0.30–0.70)
Heparin Unfractionated: 0.45 IU/mL (ref 0.30–0.70)

## 2014-08-23 MED ORDER — LOPERAMIDE HCL 2 MG PO CAPS
2.0000 mg | ORAL_CAPSULE | ORAL | Status: DC | PRN
  Administered 2014-08-23 – 2014-08-27 (×2): 2 mg via ORAL
  Filled 2014-08-23 (×2): qty 1

## 2014-08-23 MED ORDER — FUROSEMIDE 10 MG/ML IJ SOLN
20.0000 mg | Freq: Once | INTRAMUSCULAR | Status: AC
  Administered 2014-08-23: 20 mg via INTRAVENOUS
  Filled 2014-08-23: qty 2

## 2014-08-23 NOTE — Progress Notes (Signed)
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  Worked on retrieving clothes, standing tolerance and toileting.  She is agreeable to SNF as she doesn't have 24/7  Follow Up Recommendations  SNF    Equipment Recommendations       Recommendations for Other Services      Precautions / Restrictions Precautions Precautions: Fall Restrictions Weight Bearing Restrictions: No       Mobility Bed Mobility           Sit to supine: Supervision   General bed mobility comments: assist for lines  Transfers     Transfers: Sit to/from Stand Sit to Stand: Supervision         General transfer comment: manage lines    Balance                                   ADL                           Toilet Transfer: Min guard;BSC;Ambulation             General ADL Comments: worked on retrieving items in room and ambulated to bathroom to sink.  Pt had already completed adls this am.  Pt was agreeable to sitting up in chair while therapist was near by.  I returned 17 minutes later to help her back to bed.  She used 3:1 commode due to urgency.  She is agreeable to SNF for short term rehab as she doesn't have 24/7 at home      Vision                     Perception     Praxis      Cognition   Behavior During Therapy: [facility]FL for tasks assessed/performed Overall Cognitive Status: Within Functional Limits for tasks assessed                       Extremity/Trunk Assessment               Exercises     Shoulder Instructions       General Comments      Pertinent Vitals/ Pain       Pain Score: 2  Pain Location: abdomen Pain Descriptors / Indicators: Sore Pain Intervention(s): Limited activity within patient's tolerance;Monitored  during session;Repositioned  Home Living                                          Prior Functioning/Environment              Frequency Min 2X/week     Progress Toward Goals  OT Goals(current goals can now be found in the care plan section)  Progress towards OT goals: Progressing toward goals     Plan Discharge plan needs to be updated    Co-evaluation                 End of Session     Activity Tolerance  tolerated well    Patient Left  left in bed with call  bell nearby   Nurse Communication          Time: [phone] and 920-935 OT Time Calculation (min): 30 min  Charges: OT General Charges $OT Visit: 1 Procedure OT Treatments $Self Care/Home Management : 23-37 mins  Kayla Austin 08/23/2014, 10:12 AM   Kayla OtterMaryellen Hugh Austin, OTR/L [phone] 08/23/2014

## 2014-08-23 NOTE — Progress Notes (Signed)
ANTICOAGULATION CONSULT NOTE - F/u Consult  Pharmacy Consult for heparin infusion Indication: atrial fibrillation, on chronic Apixaban PTA  Assessment: [age] yoF POD5 s/p robot assisted laproscopic low anterior resection. PMH includes atrial fibrillation, on Apixaban at home. Patient has been anticoagulated with prophylactic Lovenox. Last dose of Lovenox 10/26 0740.  Pharmacy requested to start heparin infusion.   First heparin level 0.7 with infusion at 1000 units/hr (following a bolus dose).  Heparin level therapeutic but at top of therapeutic range.  0230 HL= 0.55 units/ml  1000 HL = 0.45 units/ml  No problems reported per RN.   CBC: Hgb low but stable, Pltc WNL  Significant events: 10/24: pt placed on lovenox post surgery 10/27: lovenox switched to heparin infusion  Goal of Therapy:  Heparin level 0.3-0.7 units/ml Monitor platelets by anticoagulation protocol: Yes   Plan:  1.  Continue heparin drip @ 950 units/hr 2.  Recheck HL in am 3.  Likely transition back to Eliquis tomorrow  Otho BellowsGreen, Teva Bronkema L PharmD Pager [phone] 08/23/2014, 11:14 AM

## 2014-08-23 NOTE — Progress Notes (Signed)
5 Days Post-Op Robotic LAR  Subjective: Still with frequent BM's.  C diff neg.  UOP seems adequate.  No fevers.  Did better yesterday with PO intake.  Objective: Vital signs in last 24 hours: Temp:  [98.4 F (36.9 C)-98.5 F (36.9 C)] 98.5 F (36.9 C) (10/28 0600) Pulse Rate:  [73-107] 102 (10/28 0600) Resp:  [16] 16 (10/28 0600) BP: (148-174)/(57-79) 148/62 mmHg (10/28 0600) SpO2:  [100 %] 100 % (10/28 0600) Last BM Date: 08/23/14  Intake/Output from previous day: 10/27 0701 - 10/28 0700 In: 3442.1 [P.O.:320; I.V.:3122.1] Out: 1089 [Urine:702; Drains:377; Stool:10] Intake/Output this shift:    Resp: clear to auscultation bilaterally Cardio: regular rate and rhythm GI: soft, mild tenderness. incisions ok. Less distended JP: Serous  Lab Results:   Recent Labs  08/22/14 0502 08/23/14 0235  WBC 8.0 5.9  HGB 8.8* 8.8*  HCT 26.0* 26.5*  PLT 234 274   BMET  Recent Labs  08/22/14 0502 08/23/14 0235  NA 136* 136*  K 3.4* 3.4*  CL 101 101  CO2 23 22  GLUCOSE 114* 113*  BUN 6 5*  CREATININE 1.03 1.01  CALCIUM 8.5 8.3*   PT/INR  Recent Labs  08/22/14 0840  LABPROT 14.1  INR 1.08   ABG No results found for this basename: PHART, PCO2, PO2, HCO3,  in the last 72 hours  Studies/Results: No results found.  Anti-infectives: Anti-infectives   Start     Dose/Rate Route Frequency Ordered Stop   08/18/14 1800  clindamycin (CLEOCIN) IVPB 900 mg     900 mg 100 mL/hr over 30 Minutes Intravenous 3 times per day 08/18/14 1652 08/18/14 1800   08/18/14 0551  clindamycin (CLEOCIN) IVPB 900 mg     900 mg 100 mL/hr over 30 Minutes Intravenous 60 min pre-op 08/18/14 0551 08/18/14 0742   08/18/14 0551  gentamicin (GARAMYCIN) 410 mg in dextrose 5 % 100 mL IVPB     5 mg/kg  82.6 kg 110.3 mL/hr over 60 Minutes Intravenous 60 min pre-op 08/18/14 0551 08/18/14 0827      Assessment/Plan: s/p Procedure(s): XI ROBOT ASSISTED LAPAROSCOPIC LOW ANTERIOR RESECTION (N/A) Cont  soft diet, encourage liquid intake  Ambulate, PT/OT eval: Will plan on d/c to snf.  Most likely Fri Cont hep gtt for anticoagulation for A fib.  Will change to po anticoagulation tom if pt continues to improve Diarrhea: will start imodium since c diff is neg   LOS: 5 days    Emerly Prak C. 08/23/2014

## 2014-08-23 NOTE — Progress Notes (Signed)
ANTICOAGULATION CONSULT NOTE - F/u Consult  Pharmacy Consult for heparin infusion Indication: atrial fibrillation  Assessment: [age] yoF POD5 s/p robot assisted laproscopic low anterior resection. PMH includes atrial fibrillation, on Apixaban at home. Patient has been anticoagulated with prophylactic Lovenox. Last dose of Lovenox 10/26 0740.  Pharmacy requested to start heparin infusion.   First heparin level 0.7 with infusion at 1000 units/hr (following a bolus dose).  Heparin level therapeutic but at top of therapeutic range.  0230 HL= 0.55 units/ml  No problems reported per RN.   CBC: Hgb low but stable, Pltc WNL  Significant events: 10/24: pt placed on lovenox post surgery 10/27: lovenox switched to heparin infusion  Goal of Therapy:  Heparin level 0.3-0.7 units/ml Monitor platelets by anticoagulation protocol: Yes   Plan:  1.  Continue heparin drip @ 950 units/hr 2.  Recheck HL @ 10 am today 3.  F/u transition back to Eliquis when patient tolerating PO diet without N/V.  Kayla Austin, Kayla Austin 08/23/2014 3:48 AM

## 2014-08-23 NOTE — Telephone Encounter (Signed)
Left message to update family.

## 2014-08-24 ENCOUNTER — Inpatient Hospital Stay (HOSPITAL_COMMUNITY): Payer: Medicare Other

## 2014-08-24 LAB — HEPARIN LEVEL (UNFRACTIONATED)
HEPARIN UNFRACTIONATED: 0.38 [IU]/mL (ref 0.30–0.70)
Heparin Unfractionated: 0.31 IU/mL (ref 0.30–0.70)

## 2014-08-24 MED ORDER — HEPARIN (PORCINE) IN NACL 100-0.45 UNIT/ML-% IJ SOLN
1050.0000 [IU]/h | INTRAMUSCULAR | Status: DC
  Administered 2014-08-25 – 2014-08-27 (×3): 1050 [IU]/h via INTRAVENOUS
  Filled 2014-08-24 (×6): qty 250

## 2014-08-24 MED ORDER — LIP MEDEX EX OINT
TOPICAL_OINTMENT | CUTANEOUS | Status: AC
  Administered 2014-08-24: 1
  Filled 2014-08-24: qty 7

## 2014-08-24 NOTE — Progress Notes (Signed)
ANTICOAGULATION CONSULT NOTE - F/u Consult  Pharmacy Consult for heparin infusion Indication: atrial fibrillation, on chronic Apixaban PTA  Assessment: [age] yoF POD6 s/p robot assisted laproscopic low anterior resection. PMH includes atrial fibrillation, on Apixaban at home. Patient has been anticoagulated with prophylactic Lovenox. Last dose of Lovenox 10/26 0740.  Pharmacy requested to start heparin infusion.   First heparin level 0.7 with infusion at 1000 units/hr (following a bolus dose).  Heparin level therapeutic but at top of therapeutic range.  CBC: Hgb low but stable, Pltc WNL  Heparin level 0.31 on 950 units/hr this am  Significant events: 10/24: pt placed on lovenox post surgery 10/27: lovenox switched to heparin infusion 10/29: rate increase  Goal of Therapy:  Heparin level 0.3-0.7 units/ml Monitor platelets by anticoagulation protocol: Yes   Plan:  1.  Increase heparin drip to 1050 units/hr 2.  Recheck HL at 1800 3.  Possible transition back to Eliquis tomorrow if no N/V  Otho BellowsGreen, Carmina Walle L PharmD Pager [phone] 08/24/2014, 10:32 AM

## 2014-08-24 NOTE — Progress Notes (Signed)
ANTICOAGULATION CONSULT NOTE - F/u Consult  Pharmacy Consult for heparin infusion Indication: atrial fibrillation, on chronic Apixaban PTA  Assessment: [age] yoF POD6 s/p robot assisted laproscopic low anterior resection. PMH includes atrial fibrillation, on Apixaban at home. Patient has been anticoagulated with prophylactic Lovenox. Last dose of Lovenox 10/26 0740.  Pharmacy requested to start heparin infusion.   First heparin level 0.7 with infusion at 1000 units/hr (following a bolus dose).  Heparin level therapeutic but at top of therapeutic range.  CBC: Hgb low but stable, Pltc WNL  Heparin level 0.31 on 950 units/hr this am  1800 Heparin level = 0.35 on 1050 units/hr  Significant events: 10/24: pt placed on lovenox post surgery 10/27: lovenox switched to heparin infusion 10/29: rate increase  Goal of Therapy:  Heparin level 0.3-0.7 units/ml Monitor platelets by anticoagulation protocol: Yes   Plan:  1.  Continue heparin drip at 1050 units/hr 2.  Recheck HL in the AM  Loma BostonLaura Kristilyn Coltrane, PharmD Pager: [phone] 08/24/2014 7:35 PM

## 2014-08-24 NOTE — Progress Notes (Signed)
6 Days Post-Op Robotic LAR  Subjective: Less BM's with the imodium but still loose.  C diff neg.  UOP seems adequate.  No fevers. Had another episode of abd pain and vomiting yesterday evening.  Objective: Vital signs in last 24 hours: Temp:  [98.2 F (36.8 C)-99 F (37.2 C)] 98.4 F (36.9 C) (10/29 0600) Pulse Rate:  [79-99] 81 (10/29 0600) Resp:  [16-18] 16 (10/29 0600) BP: (142-167)/(56-100) 159/65 mmHg (10/29 0600) SpO2:  [100 %] 100 % (10/29 0600) Last BM Date: 08/23/14  Intake/Output from previous day: 10/28 0701 - 10/29 0700 In: 2043 [P.O.:480; I.V.:1563] Out: 467 [Urine:400; Drains:67] Intake/Output this shift: Total I/O In: 120 [P.O.:120] Out: -   Resp: clear to auscultation bilaterally Cardio: regular rate and rhythm GI: soft, mild tenderness. incisions ok. Less distended JP: Serous  Lab Results:   Recent Labs  08/22/14 0502 08/23/14 0235  WBC 8.0 5.9  HGB 8.8* 8.8*  HCT 26.0* 26.5*  PLT 234 274   BMET  Recent Labs  08/22/14 0502 08/23/14 0235  NA 136* 136*  K 3.4* 3.4*  CL 101 101  CO2 23 22  GLUCOSE 114* 113*  BUN 6 5*  CREATININE 1.03 1.01  CALCIUM 8.5 8.3*   PT/INR  Recent Labs  08/22/14 0840  LABPROT 14.1  INR 1.08   ABG No results found for this basename: PHART, PCO2, PO2, HCO3,  in the last 72 hours  Studies/Results: No results found.  Anti-infectives: Anti-infectives   Start     Dose/Rate Route Frequency Ordered Stop   08/18/14 1800  clindamycin (CLEOCIN) IVPB 900 mg     900 mg 100 mL/hr over 30 Minutes Intravenous 3 times per day 08/18/14 1652 08/18/14 1800   08/18/14 0551  clindamycin (CLEOCIN) IVPB 900 mg     900 mg 100 mL/hr over 30 Minutes Intravenous 60 min pre-op 08/18/14 0551 08/18/14 0742   08/18/14 0551  gentamicin (GARAMYCIN) 410 mg in dextrose 5 % 100 mL IVPB     5 mg/kg  82.6 kg 110.3 mL/hr over 60 Minutes Intravenous 60 min pre-op 08/18/14 0551 08/18/14 0827      Assessment/Plan: s/p  Procedure(s): XI ROBOT ASSISTED LAPAROSCOPIC LOW ANTERIOR RESECTION (N/A) Cont soft diet, encourage liquid intake  Will check AAS today to eval for partial obstruction Ambulate, PT/OT eval: Will plan on d/c to snf once pt's GI function is better Cont hep gtt for anticoagulation for A fib.  Will change to po anticoagulation tom if pt continues to improve Diarrhea: cont imodium since c diff is neg Cont IVF's until PO intake better  LOS: 6 days    Kayla Austin C. 08/24/2014

## 2014-08-24 NOTE — Progress Notes (Addendum)
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  She requested the use of RW for transfers and gait d/t her current complaints.  Pt denies any dizziness or SOB.  Patient is supervision-min guard A with all mobility.  Pt completed gait training in hallway without increased symptoms.  Pt was assisted to bathroom and back to bed following tx.    Follow Up Recommendations  Supervision/Assistance - 24 hour;SNF     Equipment Recommendations  Cane    Recommendations for Other Services       Precautions / Restrictions Precautions Precautions: Fall Restrictions Weight Bearing Restrictions: No    Mobility  Bed Mobility Overal bed mobility: Modified Independent Bed Mobility: Sit to Supine;Supine to Sit   Sidelying to sit: Supervision Supine to sit: Supervision Sit to supine: Supervision   General bed mobility comments: assist for lines  Transfers Overall transfer level: Modified independent Equipment used: Rolling walker (2 wheeled) Transfers: Sit to/from Stand Sit to Stand: Supervision         General transfer comment: Patient was slightly impulsive this session; RW was utilized d/t pt c/o increased fatigue and nausea compared to last session  Ambulation/Gait Ambulation/Gait assistance: Min guard Ambulation Distance (Feet): 200 Feet Assistive device: Rolling walker (2 wheeled) Gait Pattern/deviations: Step-through pattern Gait velocity: WFL   General Gait Details: Patient used RW this tx d/t increased fatigue and nausea compared to last session; pt was slightly impulsive   Stairs            Wheelchair Mobility    Modified Rankin (Stroke Patients Only)       Balance                                     Cognition Arousal/Alertness: Awake/alert Behavior During Therapy: WFL for tasks assessed/performed Overall Cognitive Status: Within Functional Limits for tasks assessed                      Exercises      General Comments        Pertinent Vitals/Pain Pain Assessment: 0-10 Pain Score: 7  Pain Location: abdomen Pain Intervention(s): Monitored during session;Repositioned    Home Living                      Prior Function            PT Goals (current goals can now be found in the care plan section) Acute Rehab PT Goals PT Goal Formulation: With patient Time For Goal Achievement: 08/26/14 Potential to Achieve Goals: Good Progress towards PT goals: Progressing toward goals    Frequency  Min 3X/week    PT Plan Current plan remains appropriate    Co-evaluation             End of Session Equipment Utilized During Treatment: Gait belt Activity Tolerance: Patient tolerated treatment well Patient left: with call bell/phone within reach;in bed     Time: 1214-1232 PT Time Calculation (min): 18 min  Charges:                       G Codes:  Miller,Derrick, SPTA 08/24/2014, 12:56 PM  reviwed above Felecia ShellingLori Minoru Chap  PTA [facility]  Acute  Rehab Pager      [phone]

## 2014-08-25 LAB — CBC
HCT: 24.6 % — ABNORMAL LOW (ref 36.0–46.0)
Hemoglobin: 8.3 g/dL — ABNORMAL LOW (ref 12.0–15.0)
MCH: 27 pg (ref 26.0–34.0)
MCHC: 33.7 g/dL (ref 30.0–36.0)
MCV: 80.1 fL (ref 78.0–100.0)
PLATELETS: 296 10*3/uL (ref 150–400)
RBC: 3.07 MIL/uL — ABNORMAL LOW (ref 3.87–5.11)
RDW: 16.3 % — AB (ref 11.5–15.5)
WBC: 7 10*3/uL (ref 4.0–10.5)

## 2014-08-25 LAB — POTASSIUM: POTASSIUM: 3.1 meq/L — AB (ref 3.7–5.3)

## 2014-08-25 LAB — BASIC METABOLIC PANEL
ANION GAP: 15 (ref 5–15)
BUN: 4 mg/dL — ABNORMAL LOW (ref 6–23)
CHLORIDE: 102 meq/L (ref 96–112)
CO2: 23 mEq/L (ref 19–32)
Calcium: 7.9 mg/dL — ABNORMAL LOW (ref 8.4–10.5)
Creatinine, Ser: 1.01 mg/dL (ref 0.50–1.10)
GFR, EST AFRICAN AMERICAN: 60 mL/min — AB (ref >90)
GFR, EST NON AFRICAN AMERICAN: 52 mL/min — AB (ref >90)
Glucose, Bld: 94 mg/dL (ref 70–99)
POTASSIUM: 2.8 meq/L — AB (ref 3.7–5.3)
SODIUM: 140 meq/L (ref 137–147)

## 2014-08-25 LAB — HEPARIN LEVEL (UNFRACTIONATED): HEPARIN UNFRACTIONATED: 0.4 [IU]/mL (ref 0.30–0.70)

## 2014-08-25 MED ORDER — KCL IN DEXTROSE-NACL 40-5-0.45 MEQ/L-%-% IV SOLN
INTRAVENOUS | Status: DC
  Administered 2014-08-25: 75 mL/h via INTRAVENOUS
  Administered 2014-08-26: 30 mL via INTRAVENOUS
  Administered 2014-08-26 – 2014-08-29 (×3): 30 mL/h via INTRAVENOUS
  Filled 2014-08-25 (×5): qty 1000

## 2014-08-25 MED ORDER — POTASSIUM CHLORIDE 10 MEQ/100ML IV SOLN
10.0000 meq | INTRAVENOUS | Status: AC
  Administered 2014-08-25 (×3): 10 meq via INTRAVENOUS
  Filled 2014-08-25 (×4): qty 100

## 2014-08-25 MED ORDER — POTASSIUM CHLORIDE 10 MEQ/100ML IV SOLN
10.0000 meq | INTRAVENOUS | Status: AC
  Administered 2014-08-25: 10 meq via INTRAVENOUS
  Filled 2014-08-25 (×4): qty 100

## 2014-08-25 NOTE — Progress Notes (Signed)
CSW assisting with d/c planning. Pt is not stable for d/c today. Granddaughter Kris Mouton( Shamekka Birdsong [phone] ) and [facility] updated. SNF is able to admit over the weekend if ready for d/c. Weekend CSW will assist with d/c planning as needed.  Cori RazorJamie Agron Swiney LCSW [phone]

## 2014-08-25 NOTE — Progress Notes (Signed)
7 Days Post-Op Robotic LAR  Subjective: BM's a bit more solid.  Passing flatus.  C diff neg.  UOP seems adequate.  No fevers. Having episodes of abd pain in the evening.  AAS showed ileus.  No signs of infection or leak.  Objective: Vital signs in last 24 hours: Temp:  [98.1 F (36.7 C)-99.1 F (37.3 C)] 98.6 F (37 C) (10/30 0444) Pulse Rate:  [77-87] 83 (10/30 0444) Resp:  [18] 18 (10/30 0444) BP: (142-165)/(58-71) 146/58 mmHg (10/30 0444) SpO2:  [99 %-100 %] 99 % (10/30 0444) Last BM Date: 08/23/14  Intake/Output from previous day: 10/29 0701 - 10/30 0700 In: 560 [P.O.:360; I.V.:200] Out: 1100 [Urine:1000; Drains:100] Intake/Output this shift:    Resp: clear to auscultation bilaterally Cardio: regular rate and rhythm GI: soft, mild tenderness. incisions ok. Less distended JP: Serous  Lab Results:   Recent Labs  08/23/14 0235 08/25/14 0531  WBC 5.9 7.0  HGB 8.8* 8.3*  HCT 26.5* 24.6*  PLT 274 296   BMET  Recent Labs  08/23/14 0235 08/25/14 0531  NA 136* 140  K 3.4* 2.8*  CL 101 102  CO2 22 23  GLUCOSE 113* 94  BUN 5* 4*  CREATININE 1.01 1.01  CALCIUM 8.3* 7.9*   PT/INR  Recent Labs  08/22/14 0840  LABPROT 14.1  INR 1.08   ABG No results found for this basename: PHART, PCO2, PO2, HCO3,  in the last 72 hours  Studies/Results: Dg Abd Acute W/chest  08/24/2014   CLINICAL DATA:  Abdominal pain and distention  EXAM: ACUTE ABDOMEN SERIES (ABDOMEN 2 VIEW & CHEST 1 VIEW)  COMPARISON:  None.  FINDINGS: Minimal opacity in the left midlung zone is nonspecific. Normal heart size. No pneumothorax.  There is no free intraperitoneal gas. Innumerable distended small bowel loops are present with air-fluid levels. Postoperative changes are noted. Surgical drain projects over the pelvis. Moderate gas is present in the colon.  IMPRESSION: Given the history an appearance of abdomen the findings most likely represent postop ileus.  Left midlung hazy airspace disease  versus volume loss.  No free intraperitoneal gas.   Electronically Signed   By: Maryclare BeanArt  Hoss M.D.   On: 08/24/2014 11:42    Anti-infectives: Anti-infectives   Start     Dose/Rate Route Frequency Ordered Stop   08/18/14 1800  clindamycin (CLEOCIN) IVPB 900 mg     900 mg 100 mL/hr over 30 Minutes Intravenous 3 times per day 08/18/14 1652 08/18/14 1800   08/18/14 0551  clindamycin (CLEOCIN) IVPB 900 mg     900 mg 100 mL/hr over 30 Minutes Intravenous 60 min pre-op 08/18/14 0551 08/18/14 0742   08/18/14 0551  gentamicin (GARAMYCIN) 410 mg in dextrose 5 % 100 mL IVPB     5 mg/kg  82.6 kg 110.3 mL/hr over 60 Minutes Intravenous 60 min pre-op 08/18/14 0551 08/18/14 0827      Assessment/Plan: s/p Procedure(s): XI ROBOT ASSISTED LAPAROSCOPIC LOW ANTERIOR RESECTION (N/A) Cont soft diet, encourage liquid intake  Hypokalemia: will replace via IV, recheck this afternoon Ambulate, PT/OT eval: Will plan on d/c to snf once pt's GI function is better Cont hep gtt for anticoagulation for A fib.  Will change to po anticoagulation tom if pt continues to improve Diarrhea: cont imodium since c diff is neg Cont IVF's until PO intake better  LOS: 7 days    Alazne Quant C. 08/25/2014

## 2014-08-25 NOTE — Progress Notes (Signed)
ANTICOAGULATION CONSULT NOTE - Follow Up Consult  Pharmacy Consult for Heparin Indication: atrial fibrillation, on chronic Apixaban PTA  Allergies  Allergen Reactions  . Ampicillin Rash    Patient Measurements: Height: 5\' 3"  (160 cm) Weight: 188 lb 7.9 oz (85.5 kg) IBW/kg (Calculated) : 52.4 Heparin Dosing Weight:   Vital Signs: Temp: 98.6 F (37 C) (10/30 0444) Temp Source: Oral (10/30 0444) BP: 146/58 mmHg (10/30 0444) Pulse Rate: 83 (10/30 0444)  Labs:  Recent Labs  08/22/14 0840  08/23/14 0235  08/24/14 0520 08/24/14 1745 08/25/14 0531  HGB  --   --  8.8*  --   --   --  8.3*  HCT  --   --  26.5*  --   --   --  24.6*  PLT  --   --  274  --   --   --  296  APTT 26  --   --   --   --   --   --   LABPROT 14.1  --   --   --   --   --   --   INR 1.08  --   --   --   --   --   --   HEPARINUNFRC  --   < > 0.55  < > 0.31 0.38 0.40  CREATININE  --   --  1.01  --   --   --   --   < > = values in this interval not displayed.  Estimated Creatinine Clearance: 46.8 ml/min (by C-G formula based on Cr of 1.01).   Medications:  Infusions:  . sodium chloride 75 mL/hr (08/24/14 1546)  . heparin 1,050 Units/hr (08/24/14 1030)    Assessment: Patient with heparin level at goal. No issues per RN.  Goal of Therapy:  Heparin level 0.3-0.7 units/ml Monitor platelets by anticoagulation protocol: Yes   Plan:  Continue heparin drip at current rate Recheck level with AM labs  Darlina GuysGrimsley Jr, Jacquenette ShoneJulian Crowford 08/25/2014,6:09 AM

## 2014-08-25 NOTE — Progress Notes (Signed)
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  She was able to complete gait training in the hallway with no c/o of increased pain, but does get tired after approx. 200 ft.  Patient is min guard - supervision A with all mobility.    Follow Up Recommendations  Supervision/Assistance - 24 hour;SNF     Equipment Recommendations  Cane    Recommendations for Other Services       Precautions / Restrictions Precautions Precautions: Fall Restrictions Weight Bearing Restrictions: No    Mobility  Bed Mobility Overal bed mobility: Modified Independent Bed Mobility: Sit to Supine;Supine to Sit     Supine to sit: Supervision Sit to supine: Supervision   General bed mobility comments: assist for lines  Transfers Overall transfer level: Modified independent Equipment used: Rolling walker (2 wheeled) Transfers: Sit to/from Stand Sit to Stand: Supervision         General transfer comment: [facility] 3x; [facility]  Ambulation/Gait Ambulation/Gait assistance: Min guard Ambulation Distance (Feet): 200 Feet Assistive device: Rolling walker (2 wheeled) Gait Pattern/deviations: Step-through pattern Gait velocity: WFL   General Gait Details: Patient requested use of RW d/t fatigue and weakness.     Stairs            Wheelchair Mobility    Modified Rankin (Stroke Patients Only)       Balance                                    Cognition Arousal/Alertness: Awake/alert Behavior During Therapy: WFL for tasks assessed/performed Overall Cognitive Status: Within Functional Limits for tasks assessed                      Exercises      General Comments        Pertinent Vitals/Pain Pain Assessment:  0-10 Pain Score: 6  Pain Location: abdomen Pain Intervention(s): Monitored during session;Repositioned    Home Living                      Prior Function            PT Goals (current goals can now be found in the care plan section) Acute Rehab PT Goals PT Goal Formulation: With patient Time For Goal Achievement: 08/26/14 Potential to Achieve Goals: Good Progress towards PT goals: Progressing toward goals    Frequency  Min 3X/week    PT Plan Current plan remains appropriate    Co-evaluation             End of Session Equipment Utilized During Treatment: Gait belt Activity Tolerance: Patient tolerated treatment well Patient left: in bed;with call bell/phone within reach     Time: 1131-1154 PT Time Calculation (min): 23 min  Charges:                       G Codes:      Miller,Derrick, SPTA 08/25/2014, 12:08 PM   Reviewed above   Felecia ShellingLori Harris Penton  PTA [facility]  Acute  Rehab Pager      [phone]

## 2014-08-25 NOTE — Progress Notes (Signed)
Per Jacalyn LefevreJessy from Pharmacy, at this time leave heparin rate as is.

## 2014-08-26 LAB — CBC
HCT: 24.6 % — ABNORMAL LOW (ref 36.0–46.0)
Hemoglobin: 8.3 g/dL — ABNORMAL LOW (ref 12.0–15.0)
MCH: 27.5 pg (ref 26.0–34.0)
MCHC: 33.7 g/dL (ref 30.0–36.0)
MCV: 81.5 fL (ref 78.0–100.0)
Platelets: 338 10*3/uL (ref 150–400)
RBC: 3.02 MIL/uL — ABNORMAL LOW (ref 3.87–5.11)
RDW: 16.9 % — AB (ref 11.5–15.5)
WBC: 7.6 10*3/uL (ref 4.0–10.5)

## 2014-08-26 LAB — POTASSIUM: POTASSIUM: 3.5 meq/L — AB (ref 3.7–5.3)

## 2014-08-26 LAB — HEPARIN LEVEL (UNFRACTIONATED): HEPARIN UNFRACTIONATED: 0.51 [IU]/mL (ref 0.30–0.70)

## 2014-08-26 MED ORDER — ENSURE PUDDING PO PUDG
1.0000 | Freq: Three times a day (TID) | ORAL | Status: DC
  Administered 2014-08-26 – 2014-08-30 (×9): 1 via ORAL
  Filled 2014-08-26 (×15): qty 1

## 2014-08-26 MED ORDER — POTASSIUM CHLORIDE CRYS ER 20 MEQ PO TBCR
40.0000 meq | EXTENDED_RELEASE_TABLET | Freq: Once | ORAL | Status: AC
  Administered 2014-08-26: 40 meq via ORAL
  Filled 2014-08-26: qty 2

## 2014-08-26 NOTE — Progress Notes (Signed)
Chart reviewed and MD reports no discharge today.  CSW will continue to follow and assist with discharge planning  Unk LightningHolly Selam Pietsch LCSW

## 2014-08-26 NOTE — Progress Notes (Signed)
8 Days Post-Op Robotic LAR  Subjective: BM's a bit more solid.  Passing flatus.  C diff neg.  UOP seems adequate.  No fevers. Having episodes of abd pain in the evening.  AAS showed ileus.  No signs of infection or leak.  Objective: Vital signs in last 24 hours: Temp:  [98 F (36.7 C)-98.3 F (36.8 C)] 98.2 F (36.8 C) (10/31 0520) Pulse Rate:  [68-94] 94 (10/31 0520) Resp:  [18] 18 (10/31 0520) BP: (128-149)/(47-68) 143/64 mmHg (10/31 0520) SpO2:  [99 %-100 %] 99 % (10/31 0520) Last BM Date: 08/25/14  Intake/Output from previous day: 10/30 0701 - 10/31 0700 In: -  Out: 460 [Urine:400; Drains:60] Intake/Output this shift:    Resp: clear to auscultation bilaterally Cardio: regular rate and rhythm GI: soft, mild tenderness. incisions ok. Less distended JP: Serous  Lab Results:   Recent Labs  08/25/14 0531 08/26/14 0728  WBC 7.0 7.6  HGB 8.3* 8.3*  HCT 24.6* 24.6*  PLT 296 338   BMET  Recent Labs  08/25/14 0531 08/25/14 1419 08/26/14 0728  NA 140  --   --   K 2.8* 3.1* 3.5*  CL 102  --   --   CO2 23  --   --   GLUCOSE 94  --   --   BUN 4*  --   --   CREATININE 1.01  --   --   CALCIUM 7.9*  --   --    PT/INR No results found for this basename: LABPROT, INR,  in the last 72 hours ABG No results found for this basename: PHART, PCO2, PO2, HCO3,  in the last 72 hours  Studies/Results: Dg Abd Acute W/chest  08/24/2014   CLINICAL DATA:  Abdominal pain and distention  EXAM: ACUTE ABDOMEN SERIES (ABDOMEN 2 VIEW & CHEST 1 VIEW)  COMPARISON:  None.  FINDINGS: Minimal opacity in the left midlung zone is nonspecific. Normal heart size. No pneumothorax.  There is no free intraperitoneal gas. Innumerable distended small bowel loops are present with air-fluid levels. Postoperative changes are noted. Surgical drain projects over the pelvis. Moderate gas is present in the colon.  IMPRESSION: Given the history an appearance of abdomen the findings most likely represent postop  ileus.  Left midlung hazy airspace disease versus volume loss.  No free intraperitoneal gas.   Electronically Signed   By: Maryclare BeanArt  Hoss M.D.   On: 08/24/2014 11:42    Anti-infectives: Anti-infectives   Start     Dose/Rate Route Frequency Ordered Stop   08/18/14 1800  clindamycin (CLEOCIN) IVPB 900 mg     900 mg 100 mL/hr over 30 Minutes Intravenous 3 times per day 08/18/14 1652 08/18/14 1800   08/18/14 0551  clindamycin (CLEOCIN) IVPB 900 mg     900 mg 100 mL/hr over 30 Minutes Intravenous 60 min pre-op 08/18/14 0551 08/18/14 0742   08/18/14 0551  gentamicin (GARAMYCIN) 410 mg in dextrose 5 % 100 mL IVPB     5 mg/kg  82.6 kg 110.3 mL/hr over 60 Minutes Intravenous 60 min pre-op 08/18/14 0551 08/18/14 0827      Assessment/Plan: s/p Procedure(s): XI ROBOT ASSISTED LAPAROSCOPIC LOW ANTERIOR RESECTION (N/A) Will switch to full liquids today Hypokalemia: will replace PO today Ambulate, PT/OT eval: Will plan on d/c to snf once pt's GI function is better Cont hep gtt for anticoagulation for A fib.  Will change to po anticoagulation if pt's GI function continues to improve Diarrhea: cont imodium PRN since c diff  is neg Min IVF's until PO intake better  LOS: 8 days    Kayla Austin C. 08/26/2014

## 2014-08-26 NOTE — Progress Notes (Signed)
ANTICOAGULATION CONSULT NOTE - Follow Up Consult  Pharmacy Consult for Heparin Indication: atrial fibrillation  Allergies  Allergen Reactions  . Ampicillin Rash    Patient Measurements: Height: 5\' 3"  (160 cm) Weight: 188 lb 7.9 oz (85.5 kg) IBW/kg (Calculated) : 52.4 Heparin Dosing Weight: 71.5 kg  Vital Signs: Temp: 98.2 F (36.8 C) (10/31 0520) Temp Source: Oral (10/31 0520) BP: 143/64 mmHg (10/31 0520) Pulse Rate: 94 (10/31 0520)  Labs:  Recent Labs  08/24/14 1745 08/25/14 0531 08/26/14 0447  HGB  --  8.3*  --   HCT  --  24.6*  --   PLT  --  296  --   HEPARINUNFRC 0.38 0.40 0.51  CREATININE  --  1.01  --     Estimated Creatinine Clearance: 46.8 ml/min (by C-G formula based on Cr of 1.01).   Medications:  Scheduled:  . atorvastatin  10 mg Oral QHS  . hydrochlorothiazide  25 mg Oral Daily  . losartan  25 mg Oral Q1200  . metoprolol  50 mg Oral BID   Infusions:  . dextrose 5 % and 0.45 % NaCl with KCl 40 mEq/L 75 mL/hr (08/25/14 0948)  . heparin 1,050 Units/hr (08/25/14 0736)   PRN: alum & mag hydroxide-simeth, diphenhydrAMINE, diphenhydrAMINE, HYDROmorphone (DILAUDID) injection, loperamide, metoprolol, ondansetron (ZOFRAN) IV, ondansetron  Assessment: [age] yoF s/p robot assisted laproscopic low anterior resection. PMH includes atrial fibrillation, on Apixaban at home.  Significant events:  10/24: pt placed on prophylactic lovenox post surgery  10/27: lovenox switched to heparin infusion 10/28: HL therapeutic on 950 units/hr. Post op ileus. 10/29: HL at low end of goal, rate increased 1050 units/hr 10/30: HL therapeutic. Plan transition back to apixaban tomorrow if continues to improve.  Today, 10/31:  Heparin level remains therapeutic (0.51) on 1050 units/hr  CBC pending, no bleeding reported  Goal of Therapy:  Heparin level 0.3-0.7 units/ml Monitor platelets by anticoagulation protocol: Yes   Plan:   Continue heparin infusion at 1050  units/hr  Daily heparin level, CBC while on heparin  Follow up plans for transition back to apixaban  Loralee PacasErin Rashiya Lofland, PharmD, BCPS Pager: [phone] 08/26/2014,7:30 AM

## 2014-08-27 ENCOUNTER — Inpatient Hospital Stay (HOSPITAL_COMMUNITY): Payer: Medicare Other

## 2014-08-27 LAB — CBC
HEMATOCRIT: 26.7 % — AB (ref 36.0–46.0)
Hemoglobin: 8.9 g/dL — ABNORMAL LOW (ref 12.0–15.0)
MCH: 27.3 pg (ref 26.0–34.0)
MCHC: 33.3 g/dL (ref 30.0–36.0)
MCV: 81.9 fL (ref 78.0–100.0)
PLATELETS: 369 10*3/uL (ref 150–400)
RBC: 3.26 MIL/uL — ABNORMAL LOW (ref 3.87–5.11)
RDW: 17 % — ABNORMAL HIGH (ref 11.5–15.5)
WBC: 7.1 10*3/uL (ref 4.0–10.5)

## 2014-08-27 LAB — HEPARIN LEVEL (UNFRACTIONATED): HEPARIN UNFRACTIONATED: 0.43 [IU]/mL (ref 0.30–0.70)

## 2014-08-27 MED ORDER — IOHEXOL 300 MG/ML  SOLN
50.0000 mL | Freq: Once | INTRAMUSCULAR | Status: AC | PRN
  Administered 2014-08-27: 50 mL via ORAL

## 2014-08-27 MED ORDER — IOHEXOL 300 MG/ML  SOLN
100.0000 mL | Freq: Once | INTRAMUSCULAR | Status: AC | PRN
  Administered 2014-08-27: 100 mL via INTRAVENOUS

## 2014-08-27 MED ORDER — LACTATED RINGERS IV BOLUS (SEPSIS)
500.0000 mL | Freq: Once | INTRAVENOUS | Status: AC
  Administered 2014-08-27: 500 mL via INTRAVENOUS

## 2014-08-27 MED ORDER — PANTOPRAZOLE SODIUM 40 MG PO TBEC
80.0000 mg | DELAYED_RELEASE_TABLET | Freq: Every day | ORAL | Status: DC
  Administered 2014-08-27 – 2014-08-30 (×4): 80 mg via ORAL
  Filled 2014-08-27 (×4): qty 2

## 2014-08-27 NOTE — Progress Notes (Signed)
Nutrition Brief Note  Patient identified on the Malnutrition Screening Tool (MST) Report   Wt Readings from Last 15 Encounters:  08/19/14 188 lb 7.9 oz (85.5 kg)  08/10/14 180 lb 6 oz (81.818 kg)  06/09/14 182 lb (82.555 kg)  05/18/14 189 lb (85.73 kg)  07/26/13 185 lb (83.915 kg)  10/05/12 187 lb (84.823 kg)    Body mass index is 33.4 kg/(m^2). Patient meets criteria for obesity based on current BMI.   Pt's weight has been steady. Pt was having some nausea and diarrhea after meals. Pt is feeling better today and is eating well.  Pt reports eating pudding prior to visit, also soup and apple juice.  Current diet order is full liquid, patient is consuming approximately 75% of meals at this time. Labs and medications reviewed.   No nutrition interventions warranted at this time. If nutrition issues arise, please consult RD.   Tilda FrancoLindsey Cleon Thoma, MS, RD, LDN Pager: [phone] After Hours Pager: [phone]

## 2014-08-27 NOTE — Progress Notes (Signed)
9 Days Post-Op Robotic LAR  Subjective: Pt has nausea and increased diarrhea with eating.  Having pain on the R side  Objective: Vital signs in last 24 hours: Temp:  [97.5 F (36.4 C)-98.8 F (37.1 C)] 98.8 F (37.1 C) (11/01 0541) Pulse Rate:  [79-93] 90 (11/01 0541) Resp:  [18-20] 20 (11/01 0541) BP: (132-161)/(52-74) 158/74 mmHg (11/01 0541) SpO2:  [100 %] 100 % (11/01 0541) Last BM Date: 08/26/14  Intake/Output from previous day: 10/31 0701 - 11/01 0700 In: 3216 [P.O.:480; I.V.:2736] Out: 60 [Drains:60] Intake/Output this shift:    Resp: clear to auscultation bilaterally Cardio: regular rate and rhythm GI: soft, mild tenderness. incisions ok. slightly distended JP: Serous  Lab Results:   Recent Labs  08/26/14 0728 08/27/14 0531  WBC 7.6 7.1  HGB 8.3* 8.9*  HCT 24.6* 26.7*  PLT 338 369   BMET  Recent Labs  08/25/14 0531 08/25/14 1419 08/26/14 0728  NA 140  --   --   K 2.8* 3.1* 3.5*  CL 102  --   --   CO2 23  --   --   GLUCOSE 94  --   --   BUN 4*  --   --   CREATININE 1.01  --   --   CALCIUM 7.9*  --   --    PT/INR No results for input(s): LABPROT, INR in the last 72 hours. ABG No results for input(s): PHART, HCO3 in the last 72 hours.  Invalid input(s): PCO2, PO2  Studies/Results: No results found.  Anti-infectives: Anti-infectives    Start     Dose/Rate Route Frequency Ordered Stop   08/18/14 1800  clindamycin (CLEOCIN) IVPB 900 mg     900 mg100 mL/hr over 30 Minutes Intravenous 3 times per day 08/18/14 1652 08/18/14 1800   08/18/14 0551  clindamycin (CLEOCIN) IVPB 900 mg     900 mg100 mL/hr over 30 Minutes Intravenous 60 min pre-op 08/18/14 0551 08/18/14 0742   08/18/14 0551  gentamicin (GARAMYCIN) 410 mg in dextrose 5 % 100 mL IVPB     5 mg/kg  82.6 kg110.3 mL/hr over 60 Minutes Intravenous 60 min pre-op 08/18/14 0551 08/18/14 0827      Assessment/Plan: s/p Procedure(s): XI ROBOT ASSISTED LAPAROSCOPIC LOW ANTERIOR RESECTION  (N/A) Cont full liquids today Hypokalemia: Ok today, will monitor Ambulate, PT/OT eval: Will plan on d/c to snf once pt's GI function is better Cont hep gtt for anticoagulation for A fib.  Will change to po anticoagulation if pt's GI function continues to improve Diarrhea: cont imodium PRN since c diff is neg.  Unclear etiology.  Will get CT today.  If still looks obstructed, will place NG and start TPN Min IVF's until PO intake better.  Will bolus prior to CT today  LOS: 9 days    Sheilia Reznick C. 08/27/2014

## 2014-08-27 NOTE — Plan of Care (Signed)
Problem: Phase III Progression Outcomes Goal: Pain controlled on oral analgesia Outcome: Progressing No c/o pain today; no pain meds given Goal: Discharge plan remains appropriate-arrangements made Outcome: Completed/Met Date Met:  08/27/14

## 2014-08-27 NOTE — Progress Notes (Signed)
ANTICOAGULATION CONSULT NOTE - Follow Up Consult  Pharmacy Consult for Heparin Indication: atrial fibrillation  Allergies  Allergen Reactions  . Ampicillin Rash    Patient Measurements: Height: 5\' 3"  (160 cm) Weight: 188 lb 7.9 oz (85.5 kg) IBW/kg (Calculated) : 52.4 Heparin Dosing Weight: 71.5 kg  Vital Signs: Temp: 98.8 F (37.1 C) (11/01 0541) Temp Source: Oral (11/01 0541) BP: 158/74 mmHg (11/01 0541) Pulse Rate: 90 (11/01 0541)  Labs:  Recent Labs  08/25/14 0531 08/26/14 0447 08/26/14 0728 08/27/14 0531  HGB 8.3*  --  8.3* 8.9*  HCT 24.6*  --  24.6* 26.7*  PLT 296  --  338 369  HEPARINUNFRC 0.40 0.51  --  0.43  CREATININE 1.01  --   --   --     Estimated Creatinine Clearance: 46.8 mL/min (by C-G formula based on Cr of 1.01).   Medications:  Scheduled:  . atorvastatin  10 mg Oral QHS  . feeding supplement (ENSURE)  1 Container Oral TID BM  . hydrochlorothiazide  25 mg Oral Daily  . losartan  25 mg Oral Q1200  . metoprolol  50 mg Oral BID   Infusions:  . dextrose 5 % and 0.45 % NaCl with KCl 40 mEq/L 30 mL/hr (08/26/14 0953)  . heparin 1,050 Units/hr (08/26/14 0953)   PRN: alum & mag hydroxide-simeth, diphenhydrAMINE **OR** diphenhydrAMINE, HYDROmorphone (DILAUDID) injection, loperamide, metoprolol, ondansetron **OR** ondansetron (ZOFRAN) IV  Assessment: [age] yoF s/p robot assisted laproscopic low anterior resection. PMH includes atrial fibrillation, on Apixaban at home.  Significant events:  10/24: pt placed on prophylactic lovenox post surgery  10/27: lovenox switched to heparin infusion 10/28: HL therapeutic on 950 units/hr. Post op ileus. 10/29: HL at low end of goal, rate increased 1050 units/hr 10/30: HL therapeutic. Remains on clears, reports abdominal pain. Not changing back to apixaban yet. 10/31: HL therapeutic. Plan transition back to apixaban if bowel function continues to improve  Today, 11/1:  Heparin level remains therapeutic (0.43)  on 1050 units/hr  CBC low/stable, no bleeding reported  Goal of Therapy:  Heparin level 0.3-0.7 units/ml Monitor platelets by anticoagulation protocol: Yes   Plan:   Continue heparin infusion at 1050 units/hr  Daily heparin level, CBC while on heparin  Follow up plans for transition back to apixaban  Loralee PacasErin Harshini Trent, PharmD, BCPS Pager: [phone] 08/27/2014,7:22 AM

## 2014-08-28 LAB — HEPARIN LEVEL (UNFRACTIONATED): Heparin Unfractionated: 0.42 IU/mL (ref 0.30–0.70)

## 2014-08-28 LAB — CBC
HCT: 28.6 % — ABNORMAL LOW (ref 36.0–46.0)
Hemoglobin: 9.4 g/dL — ABNORMAL LOW (ref 12.0–15.0)
MCH: 27.2 pg (ref 26.0–34.0)
MCHC: 32.9 g/dL (ref 30.0–36.0)
MCV: 82.9 fL (ref 78.0–100.0)
Platelets: 392 10*3/uL (ref 150–400)
RBC: 3.45 MIL/uL — AB (ref 3.87–5.11)
RDW: 17.2 % — AB (ref 11.5–15.5)
WBC: 8.6 10*3/uL (ref 4.0–10.5)

## 2014-08-28 MED ORDER — SUCRALFATE 1 GM/10ML PO SUSP
1.0000 g | Freq: Three times a day (TID) | ORAL | Status: DC
  Administered 2014-08-28 – 2014-08-30 (×9): 1 g via ORAL
  Filled 2014-08-28 (×13): qty 10

## 2014-08-28 NOTE — Progress Notes (Signed)
Staples and Jp drain removed per nsg student w/ instructor.  Steri strips applied to old staple  sites

## 2014-08-28 NOTE — Progress Notes (Signed)
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   OT comments  Pt not feeling great this am- but did agree to OOB  Follow Up Recommendations  SNF              Mobility Bed Mobility Overal bed mobility: Modified Independent Bed Mobility: Sit to Supine;Supine to Sit     Supine to sit: Supervision Sit to supine: Supervision   General bed mobility comments: assist for lines  Transfers Overall transfer level: Modified independent Equipment used: Rolling walker (2 wheeled) Transfers: Sit to/from UGI CorporationStand;Stand Pivot Transfers Sit to Stand: Supervision                  ADL       Grooming: Wash/dry hands;Wash/dry face;Sitting;Standing;Supervision/safety;Oral care                   Toilet Transfer: Min guard;BSC;Ambulation   Toileting- ArchitectClothing Manipulation and Hygiene: Min guard;Sit to/from stand;Cueing for sequencing;Cueing for safety                          Cognition   Behavior During Therapy: [facility] AWFL for tasks assessed/performed Overall Cognitive Status: Within Functional Limits for tasks assessed                               General Comments      Pertinent Vitals/ Pain       Pain Score: 4  Pain Location: abdoman Pain Descriptors / Indicators: Sore Pain Intervention(s): Monitored during session;Repositioned  Home Living                                          Prior Functioning/Environment              Frequency Min 2X/week     Progress Toward Goals  OT Goals(current goals can now be found in the care plan section)  Progress towards OT goals: Progressing toward goals     Plan Discharge plan remains appropriate    Co-evaluation                 End of Session     Activity Tolerance Patient tolerated treatment well   Patient Left  in chair;with call bell/phone within reach   Nurse Communication Mobility status        Time: [id number] OT Time Calculation (min): 12 min  Charges: OT General Charges $OT Visit: 1 Procedure OT Treatments $Self Care/Home Management : 8-22 mins  Ruthanne Mcneish, Karin GoldenLorraine D 08/28/2014, 12:21 PM

## 2014-08-28 NOTE — Progress Notes (Signed)
PT Cancellation Note  Patient Details Name: Kayla Austin MRN: [id number] DOB: 06/15/1932    Cancelled Treatment:     Pt requested to rest after her OT session.  Felecia ShellingLori Railee Bonillas  PTA [facility]  Acute  Rehab Pager      [phone]

## 2014-08-28 NOTE — Plan of Care (Signed)
Problem: Phase III Progression Outcomes Goal: Activity at appropriate level-compared to baseline (UP IN CHAIR FOR HEMODIALYSIS)  Outcome: Progressing     

## 2014-08-28 NOTE — Progress Notes (Signed)
10 Days Post-Op Robotic LAR  Subjective: Pt still with some right sided pain and nausea.  CT scan shows signs concerning for ulcer and no pelvic inflammation or leak.    Objective: Vital signs in last 24 hours: Temp:  [97.5 F (36.4 C)-98.8 F (37.1 C)] 98.8 F (37.1 C) (11/02 0645) Pulse Rate:  [71-99] 71 (11/02 0645) Resp:  [18-20] 18 (11/02 0645) BP: (124-153)/(52-73) 124/52 mmHg (11/02 0645) SpO2:  [100 %] 100 % (11/02 0645) Last BM Date: 08/27/14  Intake/Output from previous day: 11/01 0701 - 11/02 0700 In: 1893 [P.O.:360; I.V.:1033; IV Piggyback:500] Out: 50 [Drains:50] Intake/Output this shift:    Resp: clear to auscultation bilaterally Cardio: regular rate and rhythm GI: soft, mild tenderness. incisions ok. slightly distended JP: Serous  Lab Results:   Recent Labs  08/27/14 0531 08/28/14 0449  WBC 7.1 8.6  HGB 8.9* 9.4*  HCT 26.7* 28.6*  PLT 369 392   BMET  Recent Labs  08/25/14 1419 08/26/14 0728  K 3.1* 3.5*   PT/INR No results for input(s): LABPROT, INR in the last 72 hours. ABG No results for input(s): PHART, HCO3 in the last 72 hours.  Invalid input(s): PCO2, PO2  Studies/Results: Ct Abdomen Pelvis W Contrast  08/27/2014   CLINICAL DATA:  Recent laparoscopic low anterior resection. Abdominal pain and diarrhea  EXAM: CT ABDOMEN AND PELVIS WITH CONTRAST  TECHNIQUE: Multidetector CT imaging of the abdomen and pelvis was performed using the standard protocol following bolus administration of intravenous contrast. Oral contrast was also administered.  CONTRAST:  50mL OMNIPAQUE IOHEXOL 300 MG/ML SOLN, 100mL OMNIPAQUE IOHEXOL 300 MG/ML SOLN  COMPARISON:  May 31, 2014  FINDINGS: There is bibasilar lung atelectatic change.  There is hepatic steatosis. No focal liver lesions are identified. Gallbladder is absent. There is no intrahepatic biliary duct dilatation. The common bile duct is not dilated appreciably, although in the pancreatic head, there is  cystic dilatation. No mass or calculus is seen in the biliary ductal system.  Spleen and adrenals appear normal. There is a 1.4 x 1.4 cm cystic area in the head of the pancreas which is very little changed compared to the prior study. A cystic neoplasm in the head of the pancreas or possibly small pancreatic pseudocyst is felt to be present. No other pancreatic lesions are identified. There is no pancreatic duct dilatation.  There is scarring in the upper pole of each kidney. There is no renal hydronephrosis on either side. There is an 8 x 8 mm angiomyolipoma in the upper pole of the right kidney. No other renal masses are seen. There is no renal or ureteral calculus on either side.  There is marked wall thickening in the distal gastric antrum and pyloric region. There is some nearby inflammation in the mesenteric a adjacent to the gastric antrum anteriorly.  In the pelvis, there is a surgical drain located to the right of the rectum. There is stranding in the mesentery in the pelvis consistent with the recent partial colectomy/low anterior resection. Uterus is absent as well. There is no well-defined pelvic mass or pelvic fluid collection.  There is evidence of bowel obstruction with a transition zone in the proximal ileal region. There is no free air or portal venous air.  There is stranding in the soft tissues of the right lateral abdomen, probably of postoperative etiology. There is no well-defined mass or hematoma in this area. No abdominal wall abscess.  There is no intra-abdominal abscess or adenopathy. There is no appreciable  ascites. There is atherosclerotic change in the aorta but no aneurysm. There is degenerative change in the lumbar spine. There are no blastic or lytic bone lesions.  IMPRESSION: There is evidence of a degree of bowel obstruction with transition zone in the proximal ileum. No free air.  Postoperative change in the pelvis. Drain is located just lateral to the rectum on the right.   Postoperative change is noted in the lateral abdominal wall without mass or hematoma.  Cystic mass in the head of pancreas measuring 1.4 x 1.4 cm. This finding may warrant nonemergent MR of the pancreas pre and post-contrast to further assess.  There is diffuse wall thickening in the region of the gastric antrum and pylorus, consistent with inflammatory change. It may be prudent to consider direct visualization to assess for possible neoplasm in this area which essentially could present similarly. No localize free air is seen in this area, although there is inflammatory change in the mesentery adjacent to the gastric antrum anteriorly.  Hepatic steatosis.  Gallbladder absent.   Electronically Signed   By: Bretta BangWilliam  Woodruff M.D.   On: 08/27/2014 11:42    Anti-infectives: Anti-infectives    Start     Dose/Rate Route Frequency Ordered Stop   08/18/14 1800  clindamycin (CLEOCIN) IVPB 900 mg     900 mg100 mL/hr over 30 Minutes Intravenous 3 times per day 08/18/14 1652 08/18/14 1800   08/18/14 0551  clindamycin (CLEOCIN) IVPB 900 mg     900 mg100 mL/hr over 30 Minutes Intravenous 60 min pre-op 08/18/14 0551 08/18/14 0742   08/18/14 0551  gentamicin (GARAMYCIN) 410 mg in dextrose 5 % 100 mL IVPB     5 mg/kg  82.6 kg110.3 mL/hr over 60 Minutes Intravenous 60 min pre-op 08/18/14 0551 08/18/14 0827      Assessment/Plan: s/p Procedure(s): XI ROBOT ASSISTED LAPAROSCOPIC LOW ANTERIOR RESECTION (N/A) Advance to soft diet D/c Hep gtt as hgb trending down and she may be oozing from her presumed ulcer seen on CT CT finding concerning for duodenal ulcer.  Will start protonix and carafate  Will notify Dr Elnoria HowardHung for possible EGD if symptoms fail to improve   LOS: 10 days    Kayla Austin C. 08/28/2014

## 2014-08-29 NOTE — Progress Notes (Signed)
11 Days Post-Op Robotic LAR  Subjective: Pt still with some right sided pain.  Nausea better.  Less diarrhea.  Tolerating a diet  Objective: Vital signs in last 24 hours: Temp:  [97.6 F (36.4 C)-98.9 F (37.2 C)] 98 F (36.7 C) (11/03 0541) Pulse Rate:  [74-96] 79 (11/03 0541) Resp:  [18] 18 (11/03 0541) BP: (125-145)/(54-71) 132/54 mmHg (11/03 0541) SpO2:  [98 %-100 %] 100 % (11/03 0541) Last BM Date: 08/29/14  Intake/Output from previous day: 11/02 0701 - 11/03 0700 In: 1250 [P.O.:680; I.V.:570] Out: 1520 [Urine:1400; Drains:20; Stool:100] Intake/Output this shift:    Resp: clear to auscultation bilaterally Cardio: regular rate and rhythm GI: soft, mild tenderness. incisions ok. Less distended  Lab Results:   Recent Labs  08/27/14 0531 08/28/14 0449  WBC 7.1 8.6  HGB 8.9* 9.4*  HCT 26.7* 28.6*  PLT 369 392   BMET No results for input(s): NA, K, CL, CO2, GLUCOSE, BUN, CREATININE, CALCIUM in the last 72 hours. PT/INR No results for input(s): LABPROT, INR in the last 72 hours. ABG No results for input(s): PHART, HCO3 in the last 72 hours.  Invalid input(s): PCO2, PO2  Studies/Results: Ct Abdomen Pelvis W Contrast  08/27/2014   CLINICAL DATA:  Recent laparoscopic low anterior resection. Abdominal pain and diarrhea  EXAM: CT ABDOMEN AND PELVIS WITH CONTRAST  TECHNIQUE: Multidetector CT imaging of the abdomen and pelvis was performed using the standard protocol following bolus administration of intravenous contrast. Oral contrast was also administered.  CONTRAST:  50mL OMNIPAQUE IOHEXOL 300 MG/ML SOLN, 100mL OMNIPAQUE IOHEXOL 300 MG/ML SOLN  COMPARISON:  May 31, 2014  FINDINGS: There is bibasilar lung atelectatic change.  There is hepatic steatosis. No focal liver lesions are identified. Gallbladder is absent. There is no intrahepatic biliary duct dilatation. The common bile duct is not dilated appreciably, although in the pancreatic head, there is cystic dilatation.  No mass or calculus is seen in the biliary ductal system.  Spleen and adrenals appear normal. There is a 1.4 x 1.4 cm cystic area in the head of the pancreas which is very little changed compared to the prior study. A cystic neoplasm in the head of the pancreas or possibly small pancreatic pseudocyst is felt to be present. No other pancreatic lesions are identified. There is no pancreatic duct dilatation.  There is scarring in the upper pole of each kidney. There is no renal hydronephrosis on either side. There is an 8 x 8 mm angiomyolipoma in the upper pole of the right kidney. No other renal masses are seen. There is no renal or ureteral calculus on either side.  There is marked wall thickening in the distal gastric antrum and pyloric region. There is some nearby inflammation in the mesenteric a adjacent to the gastric antrum anteriorly.  In the pelvis, there is a surgical drain located to the right of the rectum. There is stranding in the mesentery in the pelvis consistent with the recent partial colectomy/low anterior resection. Uterus is absent as well. There is no well-defined pelvic mass or pelvic fluid collection.  There is evidence of bowel obstruction with a transition zone in the proximal ileal region. There is no free air or portal venous air.  There is stranding in the soft tissues of the right lateral abdomen, probably of postoperative etiology. There is no well-defined mass or hematoma in this area. No abdominal wall abscess.  There is no intra-abdominal abscess or adenopathy. There is no appreciable ascites. There is atherosclerotic change in  the aorta but no aneurysm. There is degenerative change in the lumbar spine. There are no blastic or lytic bone lesions.  IMPRESSION: There is evidence of a degree of bowel obstruction with transition zone in the proximal ileum. No free air.  Postoperative change in the pelvis. Drain is located just lateral to the rectum on the right.  Postoperative change is  noted in the lateral abdominal wall without mass or hematoma.  Cystic mass in the head of pancreas measuring 1.4 x 1.4 cm. This finding may warrant nonemergent MR of the pancreas pre and post-contrast to further assess.  There is diffuse wall thickening in the region of the gastric antrum and pylorus, consistent with inflammatory change. It may be prudent to consider direct visualization to assess for possible neoplasm in this area which essentially could present similarly. No localize free air is seen in this area, although there is inflammatory change in the mesentery adjacent to the gastric antrum anteriorly.  Hepatic steatosis.  Gallbladder absent.   Electronically Signed   By: Bretta BangWilliam  Woodruff M.D.   On: 08/27/2014 11:42    Anti-infectives: Anti-infectives    Start     Dose/Rate Route Frequency Ordered Stop   08/18/14 1800  clindamycin (CLEOCIN) IVPB 900 mg     900 mg100 mL/hr over 30 Minutes Intravenous 3 times per day 08/18/14 1652 08/18/14 1800   08/18/14 0551  clindamycin (CLEOCIN) IVPB 900 mg     900 mg100 mL/hr over 30 Minutes Intravenous 60 min pre-op 08/18/14 0551 08/18/14 0742   08/18/14 0551  gentamicin (GARAMYCIN) 410 mg in dextrose 5 % 100 mL IVPB     5 mg/kg  82.6 kg110.3 mL/hr over 60 Minutes Intravenous 60 min pre-op 08/18/14 0551 08/18/14 0827      Assessment/Plan: s/p Procedure(s): XI ROBOT ASSISTED LAPAROSCOPIC LOW ANTERIOR RESECTION (N/A) Advance to soft diet Hold anticoagulation due to probable duodenal ulcer seen on CT Duodenal ulcer.  Will cont protonix and carafate  Will notify Dr Elnoria HowardHung for possible EGD in the future Possible d/c tom to SNF if continues to improve   LOS: 11 days    Kayla Austin C. 08/29/2014

## 2014-08-30 DIAGNOSIS — K269 Duodenal ulcer, unspecified as acute or chronic, without hemorrhage or perforation: Secondary | ICD-10-CM | POA: Diagnosis not present

## 2014-08-30 MED ORDER — ESOMEPRAZOLE MAGNESIUM 40 MG PO PACK
40.0000 mg | PACK | Freq: Two times a day (BID) | ORAL | Status: DC
Start: 2014-08-30 — End: 2015-09-26

## 2014-08-30 MED ORDER — SUCRALFATE 1 G PO TABS: 1.0000 g | ORAL_TABLET | Freq: Three times a day (TID) | ORAL | Status: DC

## 2014-08-30 NOTE — Discharge Summary (Signed)
Physician Discharge Summary  Patient ID: Kayla Austin MRN: [id number] DOB/AGE: 78/10/1931 78 y.o.  Admit date: 08/18/2014 Discharge date: 08/30/2014  Admission Diagnoses: colovaginal fistula  Discharge Diagnoses:  Active Problems:   Duodenal ulcer Colovaginal fistula  Discharged Condition: good  Hospital Course: The patient was admitted after robotic assisted LAR for a colovaginal fistula.  She did well initially postop. Her diet was advanced slowly. Her Foley catheter was removed on postoperative day 2. After that time she started to have multiple bouts of diarrhea. A C. Difficile test was negative. Imodium was given and helped somewhat. She began to have nausea and vomiting. Her diet was held. The symptoms resolved while she was nothing by mouth. I restarted her diet slowly. She developed abdominal cramping and additional diarrhea. During this time, she was on a heparin drip as well for her atrial fibrillation. Her hemoglobin dropped approximately 1 g. A CT scan was performed. The anastomosis was intact. There was no sign of infection. There was inflammation of the duodenal bulb. I treated her for a presumed ulcer with proton pump inhibitor and sucralfate. Her symptoms resolved within 24-48 hours. She was able to tolerate a diet.  By postoperative day 11 she was tolerating a soft diet. She was ambulating without difficulty. Her pain was controlled with no narcotics. She was discharged to a skilled nursing facility for additional rehabilitation. I discussed her duodenal ulcer and bleeding with Dr. Constance HawMahan who is covering for her gastroenterologist, Dr. Elnoria HowardHung. We have decided to continue to hold her anticoagulation until she sees Dr. Elnoria HowardHung next week.  Consults: None  Significant Diagnostic Studies: labs: cbc, chemistry, C diff and radiology: CT scan: findings concerning for duodenal ulcer  Treatments: IV hydration, anticoagulation: heparin, surgery: see above and treatment for duodenal  ulcer  Discharge Exam: Blood pressure 124/43, pulse 77, temperature 98.3 F (36.8 C), temperature source Oral, resp. rate 18, height 5\' 3"  (1.6 m), weight 188 lb 7.9 oz (85.5 kg), SpO2 100 %. General appearance: alert and cooperative GI: normal findings: soft, non-tender Incision/Wound: clean, dry, intact  Disposition: SNF     Medication List    STOP taking these medications        ELIQUIS 5 MG Tabs tablet  Generic drug:  apixaban     metroNIDAZOLE 500 MG tablet  Commonly known as:  FLAGYL     neomycin 500 MG tablet  Commonly known as:  MYCIFRADIN      TAKE these medications        atorvastatin 10 MG tablet  Commonly known as:  LIPITOR  Take 10 mg by mouth at bedtime.     esomeprazole 40 MG packet  Commonly known as:  NEXIUM  Take 40 mg by mouth 2 (two) times daily.     hydrochlorothiazide 25 MG tablet  Commonly known as:  HYDRODIURIL  Take 25 mg by mouth every morning.     losartan 25 MG tablet  Commonly known as:  COZAAR  Take 25 mg by mouth daily at 12 noon.     metoprolol 50 MG tablet  Commonly known as:  LOPRESSOR  Take 50 mg by mouth 2 (two) times daily.     sucralfate 1 G tablet  Commonly known as:  CARAFATE  Take 1 tablet (1 g total) by mouth 4 (four) times daily -  with meals and at bedtime.     SYSTANE OP  Place 1 drop into both eyes daily as needed (for itchy eyes).  Follow-up Information    Follow up with [facility].   Why:  PT   Contact information:   [address] [place] [address] [phone]       Follow up with Vanita PandaHOMAS, Batul Diego C., MD. Schedule an appointment as soon as possible for a visit in 2 weeks.   Specialty:  General Surgery   Contact information:   [address] [place] [address] [phone]       Follow up with HUNG,PATRICK D, MD In 1 week.   Specialty:  Gastroenterology   Why:  Our office will schedule this apt and call you with the date.   Contact  information:   [address], [address] [place] [address] [phone]       Signed: Vanita PandaHOMAS, Romonda Parker C. 08/30/2014, 10:28 AM

## 2014-08-30 NOTE — Plan of Care (Signed)
Problem: Phase III Progression Outcomes Goal: Pain controlled on oral analgesia Outcome: Completed/Met Date Met:  08/30/14 Goal: Activity at appropriate level-compared to baseline (UP IN CHAIR FOR HEMODIALYSIS)  Outcome: Completed/Met Date Met:  08/30/14 Goal: IV changed to normal saline lock Outcome: Completed/Met Date Met:  08/30/14

## 2014-08-30 NOTE — Discharge Instructions (Signed)
ABDOMINAL SURGERY: POST OP INSTRUCTIONS ° °1. DIET: Follow a light bland diet the first 24 hours after arrival home, such as soup, liquids, crackers, etc.  Be sure to include lots of fluids daily.  Avoid fast food or heavy meals as your are more likely to get nauseated.  Do not eat any uncooked fruits or vegetables for the next 2 weeks as your colon heals. °2. Take your usually prescribed home medications unless otherwise directed. °3. PAIN CONTROL: °a. Pain is best controlled by a usual combination of three different methods TOGETHER: °i. Ice/Heat °ii. Over the counter pain medication °iii. Prescription pain medication °b. Most patients will experience some swelling and bruising around the incisions.  Ice packs or heating pads (30-60 minutes up to 6 times a day) will help. Use ice for the first few days to help decrease swelling and bruising, then switch to heat to help relax tight/sore spots and speed recovery.  Some people prefer to use ice alone, heat alone, alternating between ice & heat.  Experiment to what works for you.  Swelling and bruising can take several weeks to resolve.   °c. It is helpful to take an over-the-counter pain medication regularly for the first few weeks.  Choose one of the following that works best for you: °i. Naproxen (Aleve, etc)  Two 220mg tabs twice a day °ii. Ibuprofen (Advil, etc) Three 200mg tabs four times a day (every meal & bedtime) °iii. Acetaminophen (Tylenol, etc) 500-650mg four times a day (every meal & bedtime) °d. A  prescription for pain medication (such as oxycodone, hydrocodone, etc) should be given to you upon discharge.  Take your pain medication as prescribed.  °i. If you are having problems/concerns with the prescription medicine (does not control pain, nausea, vomiting, rash, itching, etc), please call us [phone] to see if we need to switch you to a different pain medicine that will work better for you and/or control your side effect better. °ii. If you  need a refill on your pain medication, please contact your pharmacy.  They will contact our office to request authorization. Prescriptions will not be filled after 5 pm or on week-ends. °4. Avoid getting constipated.  Between the surgery and the pain medications, it is common to experience some constipation.  Increasing fluid intake and taking a fiber supplement (such as Metamucil, Citrucel, FiberCon, MiraLax, etc) 1-2 times a day regularly will usually help prevent this problem from occurring.  A mild laxative (prune juice, Milk of Magnesia, MiraLax, etc) should be taken according to package directions if there are no bowel movements after 48 hours.   °5. Watch out for diarrhea.  If you have many loose bowel movements, simplify your diet to bland foods & liquids for a few days.  Stop any stool softeners and decrease your fiber supplement.  Switching to mild anti-diarrheal medications (Kayopectate, Pepto Bismol) can help.  If this worsens or does not improve, please call us. °6. Wash / shower every day.  You may shower over the incision / wound.  Avoid baths until the skin is fully healed.  Continue to shower over incision(s) after the dressing is off. °7. Remove your waterproof bandages 5 days after surgery.  You may leave the incision open to air.  You may replace a dressing/Band-Aid to cover the incision for comfort if you wish. °8. ACTIVITIES as tolerated:   °a. You may resume regular (light) daily activities beginning the next day--such as daily self-care, walking, climbing stairs--gradually increasing activities as   tolerated.  If you can walk 30 minutes without difficulty, it is safe to try more intense activity such as jogging, treadmill, bicycling, low-impact aerobics, swimming, etc. b. Save the most intensive and strenuous activity for last such as sit-ups, heavy lifting, contact sports, etc  Refrain from any heavy lifting or straining until you are off narcotics for pain control.   c. DO NOT PUSH THROUGH  PAIN.  Let pain be your guide: If it hurts to do something, don't do it.  Pain is your body warning you to avoid that activity for another week until the pain goes down. d. You may drive when you are no longer taking prescription pain medication, you can comfortably wear a seatbelt, and you can safely maneuver your car and apply brakes. e. Bonita QuinYou may have sexual intercourse when it is comfortable.  9. FOLLOW UP in our office a. Please call [facility] at [phone] to set up an appointment to see your surgeon in the office for a follow-up appointment approximately 1-2 weeks after your surgery. b. Make sure that you call for this appointment the day you arrive home to insure a convenient appointment time. 10. IF YOU HAVE DISABILITY OR FAMILY LEAVE FORMS, BRING THEM TO THE OFFICE FOR PROCESSING.  DO NOT GIVE THEM TO YOUR DOCTOR.   WHEN TO CALL US [phone]: 1. Poor pain control 2. Reactions / problems with new medications (rash/itching, nausea, etc)  3. Fever over 101.5 F (38.5 C) 4. Inability to urinate 5. Nausea and/or vomiting 6. Worsening swelling or bruising 7. Continued bleeding from incision. 8. Increased pain, redness, or drainage from the incision  The clinic staff is available to answer your questions during regular business hours (8:30am-5pm).  Please dont hesitate to call and ask to speak to one of our nurses for clinical concerns.   A surgeon from [facility] is always on call at the hospitals   If you have a medical emergency, go to the nearest emergency room or call 911.    [facility] [address], [address], [place], [place]  [address] ? MAIN: [phone] ? TOLL FREE: [phone] ? FAX [phone] [email or web address]    We are holding your Elaquis due to finding of a suspected bleeding duodenal ulcer.  It is important that you follow up with him next week to discuss restarting this.

## 2014-08-30 NOTE — Progress Notes (Signed)
Clinical Social Work Department CLINICAL SOCIAL WORK PLACEMENT NOTE 08/30/2014  Patient:  Kayla Austin,Kayla Austin  Account Number:  [id number] Admit date:  08/18/2014  Clinical Social Worker:  Cori RazorJAMIE Jeraldin Fesler, LCSW  Date/time:  08/22/2014 01:37 PM  Clinical Social Work is seeking post-discharge placement for this patient at the following level of care:   SKILLED NURSING   (*CSW will update this form in Epic as items are completed)   08/22/2014  Patient/family provided with [facility] Department of Clinical Social Work's list of facilities offering this level of care within the geographic area requested by the patient (or if unable, by the patient's family).  08/22/2014  Patient/family informed of their freedom to choose among providers that offer the needed level of care, that participate in Medicare, Medicaid or managed care program needed by the patient, have an available bed and are willing to accept the patient.  08/22/2014  Patient/family informed of [facility]' ownership interest in [facility], as well as of the fact that they are under no obligation to receive care at this facility.  PASARR submitted to EDS on 08/22/2014 PASARR number received on 08/22/2014  FL2 transmitted to all facilities in geographic area requested by pt/family on  08/22/2014 FL2 transmitted to all facilities within larger geographic area on   Patient informed that his/her managed care company has contracts with or will negotiate with  certain facilities, including the following:     Patient/family informed of bed offers received:  08/22/2014 [facility] Physician recommends and patient chooses bed at    [facility] on  08/30/2014 Patient to be transferred to facility by car Patient and family notified of transfer on 08/30/2014 Name of family member notified:  Granddaughter  The following physician request were entered in Epic:   Additional  Comments: Pt / granddaughter in agreement with d/c to SNF today. Pt able to transport by car. Nsg reviewed d/c summary, avs. No scripts needed.  Cori RazorJamie Thao Bauza LCSW [phone]

## 2014-08-31 ENCOUNTER — Non-Acute Institutional Stay (SKILLED_NURSING_FACILITY): Payer: Medicare Other | Admitting: Internal Medicine

## 2014-08-31 ENCOUNTER — Encounter: Payer: Self-pay | Admitting: Internal Medicine

## 2014-08-31 DIAGNOSIS — K269 Duodenal ulcer, unspecified as acute or chronic, without hemorrhage or perforation: Secondary | ICD-10-CM

## 2014-08-31 DIAGNOSIS — E785 Hyperlipidemia, unspecified: Secondary | ICD-10-CM

## 2014-08-31 DIAGNOSIS — N823 Fistula of vagina to large intestine: Secondary | ICD-10-CM

## 2014-08-31 DIAGNOSIS — I10 Essential (primary) hypertension: Secondary | ICD-10-CM

## 2014-08-31 NOTE — Progress Notes (Signed)
Patient ID: Kayla Austin, female   DOB: 08/13/1932, 80 y.o.   MRN: [id number]     [facility]   PCP: Lonia BloodGARBA,LAWAL, MD  Code Status: full code  Allergies  Allergen Reactions  . Ampicillin Rash    Chief Complaint: new admit  HPI:  80 y/o female patient is here for STR after hospital admission from 08/18/14- 08/30/14 with colovaginal fistula.she underwent robotic assisted LAR for the fistula. following this she started having bouts of diarrhea, cdiff was ruled out and imodium helped some. Ct abdomen showed anastomosis to be intact and no signs of infection. Some inflammation was noted of duodenal bulb and she was treated for ulcer with PPI and sucralfate. Her anticoagulation was held. She made improvement and was working with therapy team.  She is seen in her room today. She denies any heartburn or abdominal pain. Her stool are loose but decreased in frequency. denies any blood in stool. She has been working with therapy and would like the consistency of her diet changed. Denies any other concerns. Is walking to the bathroom and back without assistive device.   Review of Systems:  Constitutional: Negative for fever, chills, malaise/fatigue and diaphoresis.  HENT: Negative for congestion Respiratory: Negative for cough, sputum production, shortness of breath and wheezing.   Cardiovascular: Negative for chest pain, palpitations, orthopnea and leg swelling.  Gastrointestinal: Negative for heartburn, nausea, vomiting, abdominal pain Genitourinary: Negative for dysuria Musculoskeletal: Negative for back pain, falls, joint pain and myalgias.  Skin: Negative for itching and rash.  Neurological: Negative for weakness,dizziness, tingling, focal weakness and headaches.  Psychiatric/Behavioral: Negative for depression    Past Medical History  Diagnosis Date  . Hypertension   . GERD (gastroesophageal reflux disease)   . Arthritis   . Coronary artery disease     . Hyperlipemia   . Shortness of breath     on exertion  . History of colon polyps   . Dysrhythmia     a fib  . Heart murmur     hx of   . Bronchitis     2009  . Chronic kidney disease     stage 3   Past Surgical History  Procedure Laterality Date  . Abdominal hysterectomy    . Cholecystectomy    . Cardioversion  10/05/2012    Procedure: CARDIOVERSION;  Surgeon: Pamella PertJagadeesh R Ganji, MD;  [facility];  Service: Cardiovascular;  Laterality: N/A;  . Flexible sigmoidoscopy N/A 06/09/2014    Procedure: FLEXIBLE SIGMOIDOSCOPY;  Surgeon: Theda BelfastPatrick D Hung, MD;  [facility];  Service: Endoscopy;  Laterality: N/A;  . Appendectomy    . Carpal tunnel release      right hand   Social History:   reports that she has never smoked. She has never used smokeless tobacco. She reports that she drinks alcohol. She reports that she does not use illicit drugs.  Family History  Problem Relation Age of Onset  . Cancer Sister     Medications: Patient's Medications  New Prescriptions   No medications on file  Previous Medications   ATORVASTATIN (LIPITOR) 10 MG TABLET    Take 10 mg by mouth at bedtime.    ESOMEPRAZOLE (NEXIUM) 40 MG PACKET    Take 40 mg by mouth 2 (two) times daily.   HYDROCHLOROTHIAZIDE (HYDRODIURIL) 25 MG TABLET    Take 25 mg by mouth every morning.    LOSARTAN (COZAAR) 25 MG TABLET    Take 25 mg by mouth daily  at 12 noon.    METOPROLOL (LOPRESSOR) 50 MG TABLET    Take 50 mg by mouth 2 (two) times daily.   POLYETHYL GLYCOL-PROPYL GLYCOL (SYSTANE OP)    Place 1 drop into both eyes daily as needed (for itchy eyes).   SUCRALFATE (CARAFATE) 1 G TABLET    Take 1 tablet (1 g total) by mouth 4 (four) times daily -  with meals and at bedtime.  Modified Medications   No medications on file  Discontinued Medications   No medications on file     Physical Exam: Filed Vitals:   08/31/14 1500  BP: 135/60  Pulse: 80  Temp: 97.5 F (36.4 C)  Resp: 18  SpO2: 97%     General- elderly female in no acute distress Head- atraumatic, normocephalic Eyes- PERRLA, EOMI, no pallor, no icterus, no discharge Neck- no lymphadenopathy Throat- moist mucus membrane Cardiovascular- normal s1,s2, no murmurs Respiratory- bilateral clear to auscultation, no wheeze, no rhonchi, no crackles, no use of accessory muscles Abdomen- bowel sounds present, soft, non tender, no guarding or rigidity, no CVA tenderness Musculoskeletal- able to move all 4 extremities, no leg edema Neurological- no focal deficit Skin- warm and dry, steri strips in place, lower abdomen incision healing well Psychiatry- alert and oriented to person, place and time, normal mood and affect    Labs reviewed: Basic Metabolic Panel:  Recent Labs  40/98/1110/27/15 0502 08/23/14 0235 08/25/14 0531 08/25/14 1419 08/26/14 0728  NA 136* 136* 140  --   --   K 3.4* 3.4* 2.8* 3.1* 3.5*  CL 101 101 102  --   --   CO2 23 22 23   --   --   GLUCOSE 114* 113* 94  --   --   BUN 6 5* 4*  --   --   CREATININE 1.03 1.01 1.01  --   --   CALCIUM 8.5 8.3* 7.9*  --   --    CBC:  Recent Labs  08/26/14 0728 08/27/14 0531 08/28/14 0449  WBC 7.6 7.1 8.6  HGB 8.3* 8.9* 9.4*  HCT 24.6* 26.7* 28.6*  MCV 81.5 81.9 82.9  PLT 338 369 392    Assessment/Plan  Colovaginal fistula S/p repair, to follow with surgery in 2 weeks. Incision site healing well. Denies any pain. Will have patient work with PT/OT as tolerated to regain strength and restore function.  Fall precautions are in place. She is on mechanical soft diet and this appears to be mainly for bowel rest. Pt wants this advanced. Will get SLP team to assess and if no aspiration risk, it will be ok to advance her diet as tolerated  Duodenal ulcer Continue nexium 40 mg bid and carafate 1g qid for now, has follow up with GI. Avoid NSAIDs and eliquis on hold for now  HTN Continue hctz 25 mg daily, losartan 25 mg daily and lopressor 50 mg bid  HLD Continue  lipitor 10 mg daily  Family/ staff Communication: reviewed care plan with patient and nursing supervisor   Goals of care: short term rehabilitation    Labs/tests ordered    Oneal GroutMAHIMA Yulisa Chirico, MD  [facility] [phone] (Monday-Friday 8 am - 5 pm) [phone] (afterhours)

## 2014-09-01 ENCOUNTER — Non-Acute Institutional Stay (SKILLED_NURSING_FACILITY): Payer: Medicare Other | Admitting: Adult Health

## 2014-09-01 ENCOUNTER — Encounter: Payer: Self-pay | Admitting: Adult Health

## 2014-09-01 DIAGNOSIS — I10 Essential (primary) hypertension: Secondary | ICD-10-CM

## 2014-09-01 DIAGNOSIS — K269 Duodenal ulcer, unspecified as acute or chronic, without hemorrhage or perforation: Secondary | ICD-10-CM

## 2014-09-01 DIAGNOSIS — E785 Hyperlipidemia, unspecified: Secondary | ICD-10-CM

## 2014-09-01 DIAGNOSIS — N823 Fistula of vagina to large intestine: Secondary | ICD-10-CM

## 2014-09-01 NOTE — Progress Notes (Signed)
Patient ID: Kayla Austin, female   DOB: Jun 24, 1932, 80 y.o.   MRN: [id number]   09/01/2014  Facility:  Nursing Home Location:  [facility] Room Number: 205-1 LEVEL OF CARE:  SNF (31)   Chief Complaint  Patient presents with  . Discharge Note    Colovaginal fistula S/P Robotic assisted LAR, Duodenal ulcer, Hyperlipidemia and Hypertension    HISTORY OF PRESENT ILLNESS:  This is a 80 year old female who is for discharge home with Home health PT, social worker and nursing. BMP: Bedside commode. She has been admitted to [facility] on 08/30/14 from [facility] with Colovaginal fistula S/P robotic-assisted LAR  (laparoscopic low anterior resection) and Duodenal ulcer.Patient was admitted to this facility for short-term rehabilitation after the patient's recent hospitalization.  Patient has completed SNF rehabilitation and therapy has cleared the patient for discharge.  REASSESSMENT OF ONGOING PROBLEMS:  HTN: Pt 's HTN remains stable.  Denies CP, sob, DOE, pedal edema, headaches, dizziness or visual disturbances.  No complications from the medications currently being used.  Last BP : 117/54  ANEMIA: The anemia has been stable. The patient denies fatigue, melena or hematochezia.   11/15 hgb 9.4  HYPERLIPIDEMIA: No complications from the medications presently being used. Last fasting lipid panel showed : not available   PAST MEDICAL HISTORY:  Past Medical History  Diagnosis Date  . Hypertension   . GERD (gastroesophageal reflux disease)   . Arthritis   . Coronary artery disease   . Hyperlipemia   . Shortness of breath     on exertion  . History of colon polyps   . Dysrhythmia     a fib  . Heart murmur     hx of   . Bronchitis     2009  . Chronic kidney disease     stage 3    CURRENT MEDICATIONS: Reviewed per MAR/see medication list  Allergies  Allergen Reactions  . Ampicillin Rash     REVIEW OF SYSTEMS:  GENERAL: no change in  appetite, no fatigue, no weight changes, no fever, chills or weakness RESPIRATORY: no cough, SOB, DOE, wheezing, hemoptysis CARDIAC: no chest pain, edema or palpitations GI: no abdominal pain, diarrhea, constipation, heart burn, nausea or vomiting  PHYSICAL EXAMINATION  GENERAL: no acute distress, normal body habitus EYES: conjunctivae normal, sclerae normal, normal eye lids NECK: supple, trachea midline, no neck masses, no thyroid tenderness, no thyromegaly LYMPHATICS: no LAN in the neck, no supraclavicular LAN RESPIRATORY: breathing is even & unlabored, BS CTAB CARDIAC: RRR, no murmur,no extra heart sounds, no edema GI: abdomen soft, normal BS, no masses, no tenderness, no hepatomegaly, no splenomegaly EXTREMITIES: able to move all 4 extremities; walks with a cane PSYCHIATRIC: the patient is alert & oriented to person, affect & behavior appropriate  LABS/RADIOLOGY: Labs reviewed: Basic Metabolic Panel:  Recent Labs  40/10/27 0502 08/23/14 0235 08/25/14 0531 08/25/14 1419 08/26/14 0728  NA 136* 136* 140  --   --   K 3.4* 3.4* 2.8* 3.1* 3.5*  CL 101 101 102  --   --   CO2 23 22 23   --   --   GLUCOSE 114* 113* 94  --   --   BUN 6 5* 4*  --   --   CREATININE 1.03 1.01 1.01  --   --   CALCIUM 8.5 8.3* 7.9*  --   --    CBC:  Recent Labs  08/26/14 0728 08/27/14 0531 08/28/14 0449  WBC 7.6 7.1 8.6  HGB 8.3* 8.9* 9.4*  HCT 24.6* 26.7* 28.6*  MCV 81.5 81.9 82.9  PLT 338 369 392   Dg Chest 2 View  08/10/2014   CLINICAL DATA:  Hypertension.  EXAM: CHEST  2 VIEW  COMPARISON:  None.  FINDINGS: Mediastinum and hilar structures normal. Lungs clear. Heart size slightly prominent with normal pulmonary vascularity. No CHF. No pleural effusion. No pneumothorax. Degenerative changes thoracic spine. Surgical clips upper abdomen.  IMPRESSION: 1. Borderline cardiomegaly. 2. Otherwise negative chest.   Electronically Signed   By: Maisie Fushomas  Register   On: 08/10/2014 12:30   Ct Abdomen  Pelvis W Contrast  08/27/2014   CLINICAL DATA:  Recent laparoscopic low anterior resection. Abdominal pain and diarrhea  EXAM: CT ABDOMEN AND PELVIS WITH CONTRAST  TECHNIQUE: Multidetector CT imaging of the abdomen and pelvis was performed using the standard protocol following bolus administration of intravenous contrast. Oral contrast was also administered.  CONTRAST:  50mL OMNIPAQUE IOHEXOL 300 MG/ML SOLN, 100mL OMNIPAQUE IOHEXOL 300 MG/ML SOLN  COMPARISON:  May 31, 2014  FINDINGS: There is bibasilar lung atelectatic change.  There is hepatic steatosis. No focal liver lesions are identified. Gallbladder is absent. There is no intrahepatic biliary duct dilatation. The common bile duct is not dilated appreciably, although in the pancreatic head, there is cystic dilatation. No mass or calculus is seen in the biliary ductal system.  Spleen and adrenals appear normal. There is a 1.4 x 1.4 cm cystic area in the head of the pancreas which is very little changed compared to the prior study. A cystic neoplasm in the head of the pancreas or possibly small pancreatic pseudocyst is felt to be present. No other pancreatic lesions are identified. There is no pancreatic duct dilatation.  There is scarring in the upper pole of each kidney. There is no renal hydronephrosis on either side. There is an 8 x 8 mm angiomyolipoma in the upper pole of the right kidney. No other renal masses are seen. There is no renal or ureteral calculus on either side.  There is marked wall thickening in the distal gastric antrum and pyloric region. There is some nearby inflammation in the mesenteric a adjacent to the gastric antrum anteriorly.  In the pelvis, there is a surgical drain located to the right of the rectum. There is stranding in the mesentery in the pelvis consistent with the recent partial colectomy/low anterior resection. Uterus is absent as well. There is no well-defined pelvic mass or pelvic fluid collection.  There is evidence of  bowel obstruction with a transition zone in the proximal ileal region. There is no free air or portal venous air.  There is stranding in the soft tissues of the right lateral abdomen, probably of postoperative etiology. There is no well-defined mass or hematoma in this area. No abdominal wall abscess.  There is no intra-abdominal abscess or adenopathy. There is no appreciable ascites. There is atherosclerotic change in the aorta but no aneurysm. There is degenerative change in the lumbar spine. There are no blastic or lytic bone lesions.  IMPRESSION: There is evidence of a degree of bowel obstruction with transition zone in the proximal ileum. No free air.  Postoperative change in the pelvis. Drain is located just lateral to the rectum on the right.  Postoperative change is noted in the lateral abdominal wall without mass or hematoma.  Cystic mass in the head of pancreas measuring 1.4 x 1.4 cm. This finding may warrant nonemergent MR of the  pancreas pre and post-contrast to further assess.  There is diffuse wall thickening in the region of the gastric antrum and pylorus, consistent with inflammatory change. It may be prudent to consider direct visualization to assess for possible neoplasm in this area which essentially could present similarly. No localize free air is seen in this area, although there is inflammatory change in the mesentery adjacent to the gastric antrum anteriorly.  Hepatic steatosis.  Gallbladder absent.   Electronically Signed   By: Bretta BangWilliam  Woodruff M.D.   On: 08/27/2014 11:42   Dg Abd Acute W/chest  08/24/2014   CLINICAL DATA:  Abdominal pain and distention  EXAM: ACUTE ABDOMEN SERIES (ABDOMEN 2 VIEW & CHEST 1 VIEW)  COMPARISON:  None.  FINDINGS: Minimal opacity in the left midlung zone is nonspecific. Normal heart size. No pneumothorax.  There is no free intraperitoneal gas. Innumerable distended small bowel loops are present with air-fluid levels. Postoperative changes are noted. Surgical  drain projects over the pelvis. Moderate gas is present in the colon.  IMPRESSION: Given the history an appearance of abdomen the findings most likely represent postop ileus.  Left midlung hazy airspace disease versus volume loss.  No free intraperitoneal gas.   Electronically Signed   By: Maryclare BeanArt  Hoss M.D.   On: 08/24/2014 11:42    ASSESSMENT/PLAN:  Colovaginal fistula status post robotic assisted LAR - for home health PT, Nursing and social worker Duodenal ulcer - continue Carafate 1 g 4 times a day Anemia, acute blood loss - stable; hemoglobin 9.4 Hyperlipidemia - continue Lipitor Hypertension - well controlled; continue HCTZ, Cozaar and Lopressor    I have filled out patient's discharge paperwork and written prescriptions.  Patient will receive home health PT, Nursing and Social worker.  DME provided: Bedside commode  Total discharge time: Greater than 30 minutes  Discharge time involved coordination of the discharge process with social worker, nursing staff and therapy department. Medical justification for home health services/DME verified.  CPT CODE: [id number]    [facility] Amb[facility] Surgery CenterMEDINA-VARGAS,Iona Stay, NP [facility] [phone]

## 2014-09-05 DIAGNOSIS — K219 Gastro-esophageal reflux disease without esophagitis: Secondary | ICD-10-CM

## 2014-09-05 DIAGNOSIS — N824 Other female intestinal-genital tract fistulae: Secondary | ICD-10-CM

## 2014-09-05 DIAGNOSIS — Z48815 Encounter for surgical aftercare following surgery on the digestive system: Secondary | ICD-10-CM

## 2014-09-05 DIAGNOSIS — K269 Duodenal ulcer, unspecified as acute or chronic, without hemorrhage or perforation: Secondary | ICD-10-CM

## 2014-09-06 ENCOUNTER — Other Ambulatory Visit: Payer: Self-pay | Admitting: Gastroenterology

## 2014-09-06 DIAGNOSIS — R9389 Abnormal findings on diagnostic imaging of other specified body structures: Secondary | ICD-10-CM

## 2014-10-05 ENCOUNTER — Other Ambulatory Visit: Payer: Self-pay

## 2014-10-05 DIAGNOSIS — Z1231 Encounter for screening mammogram for malignant neoplasm of breast: Secondary | ICD-10-CM

## 2014-10-06 ENCOUNTER — Ambulatory Visit
Admission: RE | Admit: 2014-10-06 | Discharge: 2014-10-06 | Disposition: A | Discharge status: Home / Self Care | Payer: Medicare Other | Source: Ambulatory Visit | Attending: Gastroenterology | Admitting: Gastroenterology

## 2014-10-06 DIAGNOSIS — R9389 Abnormal findings on diagnostic imaging of other specified body structures: Secondary | ICD-10-CM

## 2014-10-06 MED ORDER — GADOBENATE DIMEGLUMINE 529 MG/ML IV SOLN
16.0000 mL | Freq: Once | INTRAVENOUS | Status: AC | PRN
  Administered 2014-10-06: 16 mL via INTRAVENOUS

## 2014-11-08 ENCOUNTER — Ambulatory Visit
Admission: RE | Admit: 2014-11-08 | Discharge: 2014-11-08 | Disposition: A | Discharge status: Home / Self Care | Payer: Medicare Other | Source: Ambulatory Visit

## 2014-11-08 DIAGNOSIS — Z1231 Encounter for screening mammogram for malignant neoplasm of breast: Secondary | ICD-10-CM

## 2015-01-02 ENCOUNTER — Other Ambulatory Visit: Payer: Self-pay | Admitting: Internal Medicine

## 2015-01-02 DIAGNOSIS — E2839 Other primary ovarian failure: Secondary | ICD-10-CM

## 2015-01-11 ENCOUNTER — Ambulatory Visit
Admission: RE | Admit: 2015-01-11 | Discharge: 2015-01-11 | Disposition: A | Discharge status: Home / Self Care | Payer: Medicare Other | Source: Ambulatory Visit | Attending: Internal Medicine | Admitting: Internal Medicine

## 2015-01-11 DIAGNOSIS — E2839 Other primary ovarian failure: Secondary | ICD-10-CM

## 2015-08-02 ENCOUNTER — Encounter (HOSPITAL_COMMUNITY): Payer: Self-pay | Admitting: *Deleted

## 2015-08-02 ENCOUNTER — Emergency Department (INDEPENDENT_AMBULATORY_CARE_PROVIDER_SITE_OTHER)
Admission: EM | Admit: 2015-08-02 | Discharge: 2015-08-02 | Disposition: A | Discharge status: Home / Self Care | Payer: Medicare Other | Source: Home / Self Care | Attending: Family Medicine | Admitting: Family Medicine

## 2015-08-02 ENCOUNTER — Emergency Department (INDEPENDENT_AMBULATORY_CARE_PROVIDER_SITE_OTHER): Payer: Medicare Other

## 2015-08-02 DIAGNOSIS — M25561 Pain in right knee: Secondary | ICD-10-CM

## 2015-08-02 MED ORDER — TRAMADOL HCL 50 MG PO TABS: 50.0000 mg | ORAL_TABLET | Freq: Four times a day (QID) | ORAL | Status: DC | PRN

## 2015-08-02 NOTE — Discharge Instructions (Signed)
The cause of your knee pain is not immediately clear Please use the tramadol for extreme pain Please use tylenol  every 8 hours for pain Please call the [facility] or at [facility] orthopedics for follow up in 1-2 weeks

## 2015-08-02 NOTE — ED Provider Notes (Signed)
CSN: [id number]     Arrival date & time 08/02/15  1458 History   First MD Initiated Contact with Patient 08/02/15 1612     Chief Complaint  Patient presents with  . Leg Pain   (Consider location/radiation/quality/duration/timing/severity/associated sxs/prior Treatment) HPI   R knee pain. Started 7 days ago. Getting worse. Fell 1 mo ago and struck knee but no residual pain in knee. Worse w/ ambulation.  Back of knee and calf.  No swelling  H/o Poor circulation Occasional claudication vs cramps  Past Medical History  Diagnosis Date  . Hypertension   . GERD (gastroesophageal reflux disease)   . Arthritis   . Coronary artery disease   . Hyperlipemia   . Shortness of breath     on exertion  . History of colon polyps   . Dysrhythmia     a fib  . Heart murmur     hx of   . Bronchitis     2009  . Chronic kidney disease     stage 3   Past Surgical History  Procedure Laterality Date  . Abdominal hysterectomy    . Cholecystectomy    . Cardioversion  10/05/2012    Procedure: CARDIOVERSION;  Surgeon: Pamella Pert, MD;  Location: [facility] ENDOSCOPY;  Service: Cardiovascular;  Laterality: N/A;  . Flexible sigmoidoscopy N/A 06/09/2014    Procedure: FLEXIBLE SIGMOIDOSCOPY;  Surgeon: Theda Belfast, MD;  Location: [facility] ENDOSCOPY;  Service: Endoscopy;  Laterality: N/A;  . Appendectomy    . Carpal tunnel release      right hand   Family History  Problem Relation Age of Onset  . Cancer Sister    Social History  Substance Use Topics  . Smoking status: Never Smoker   . Smokeless tobacco: Never Used  . Alcohol Use: Yes     Comment: occasional   OB History    No data available     Review of Systems  Allergies  Ampicillin  Home Medications   Prior to Admission medications   Medication Sig Start Date End Date Taking? Authorizing Provider  Apixaban (ELIQUIS PO) Take by mouth.   Yes Historical Provider, MD  atorvastatin (LIPITOR) 10 MG tablet Take 10 mg by mouth at bedtime.     Yes Historical Provider, MD  hydrochlorothiazide (HYDRODIURIL) 25 MG tablet Take 25 mg by mouth every morning.    Yes Historical Provider, MD  losartan (COZAAR) 25 MG tablet Take 25 mg by mouth daily at 12 noon.    Yes Historical Provider, MD  metoprolol (LOPRESSOR) 50 MG tablet Take 50 mg by mouth 2 (two) times daily.   Yes Historical Provider, MD  Polyethyl Glycol-Propyl Glycol (SYSTANE OP) Place 1 drop into both eyes daily as needed (for itchy eyes).   Yes Historical Provider, MD  sucralfate (CARAFATE) 1 G tablet Take 1 tablet (1 g total) by mouth 4 (four) times daily -  with meals and at bedtime. 08/30/14  Yes Romie Levee, MD  esomeprazole (NEXIUM) 40 MG packet Take 40 mg by mouth 2 (two) times daily. 08/30/14   Romie Levee, MD  traMADol (ULTRAM) 50 MG tablet Take 1 tablet (50 mg total) by mouth every 6 (six) hours as needed. 08/02/15   Ozella Rocks, MD   Meds Ordered and Administered this Visit  Medications - No data to display  BP 153/63 mmHg  Pulse 80  Temp(Src) 97.7 F (36.5 C) (Oral)  Resp 12  SpO2 100% No data found.   Physical Exam Physical  Exam  Constitutional: oriented to person, place, and time. appears well-developed and well-nourished. No distress.  HENT:  Head: Normocephalic and atraumatic.  Eyes: EOMI. PERRL.  Neck: Normal range of motion.  Cardiovascular: RRR, no m/r/g, 2+ distal pulses,  Pulmonary/Chest: Effort normal and breath sounds normal. No respiratory distress.  Abdominal: Soft. Bowel sounds are normal. NonTTP, no distension.  Musculoskeletal: Normal range of motion. No effusion, no evidence of cyst in poplieteal fossa. Lachman's negative. valgas and varus stresses w/o pain. Minimal posterior pain w/ palpation.   Neurological: alert and oriented to person, place, and time.  Skin: Skin is warm. No rash noted. non diaphoretic.  Psychiatric: normal mood and affect. behavior is normal. Judgment and thought content normal.   ED Course  Procedures  (including critical care time)  Labs Review Labs Reviewed - No data to display  Imaging Review Dg Knee Complete 4 Views Right  08/02/2015   CLINICAL DATA:  RIGHT knee pain for 1 week, no recent injury, pain posteriorly, fell over a month ago but caught her fall  EXAM: RIGHT KNEE - COMPLETE 4+ VIEW  COMPARISON:  None  FINDINGS: Osseous mineralization diffusely decreased.  Minimal diffuse joint space narrowing.  No acute fracture, dislocation, or bone destruction.  No knee joint effusion.  IMPRESSION: No acute osseous abnormalities.   Electronically Signed   By: Ulyses Southward M.D.   On: 08/02/2015 17:10     Visual Acuity Review  Right Eye Distance:   Left Eye Distance:   Bilateral Distance:    Right Eye Near:   Left Eye Near:    Bilateral Near:         MDM   1. Right knee pain    Etiology not immediately clear. No evidence of cyst. Xray nml. Unlikely DVT given pt on Eliquis. Possible meniscal injury. Tylenol and tramadol PRN pain. F/u ortho or SM.     Ozella Rocks, MD 08/02/15 1726

## 2015-08-02 NOTE — ED Notes (Signed)
Pt reports 1 week of right calf pain, and weakness.   worse while ambulating.

## 2015-09-12 ENCOUNTER — Ambulatory Visit (INDEPENDENT_AMBULATORY_CARE_PROVIDER_SITE_OTHER): Payer: Medicare Other

## 2015-09-12 ENCOUNTER — Encounter: Payer: Self-pay | Admitting: Podiatry

## 2015-09-12 ENCOUNTER — Ambulatory Visit (INDEPENDENT_AMBULATORY_CARE_PROVIDER_SITE_OTHER): Payer: Medicare Other | Admitting: Podiatry

## 2015-09-12 VITALS — BP 139/73 | HR 90 | Resp 16

## 2015-09-12 DIAGNOSIS — M79675 Pain in left toe(s): Secondary | ICD-10-CM | POA: Diagnosis not present

## 2015-09-12 DIAGNOSIS — M79604 Pain in right leg: Secondary | ICD-10-CM

## 2015-09-12 DIAGNOSIS — M779 Enthesopathy, unspecified: Secondary | ICD-10-CM

## 2015-09-12 DIAGNOSIS — M79605 Pain in left leg: Secondary | ICD-10-CM

## 2015-09-12 DIAGNOSIS — B351 Tinea unguium: Secondary | ICD-10-CM

## 2015-09-12 DIAGNOSIS — M79671 Pain in right foot: Secondary | ICD-10-CM

## 2015-09-12 DIAGNOSIS — M79674 Pain in right toe(s): Secondary | ICD-10-CM

## 2015-09-12 MED ORDER — TRIAMCINOLONE ACETONIDE 10 MG/ML IJ SUSP
10.0000 mg | Freq: Once | INTRAMUSCULAR | Status: AC
Start: 2015-09-12 — End: 2015-09-12
  Administered 2015-09-12: 10 mg

## 2015-09-12 NOTE — Progress Notes (Signed)
Subjective:     Patient ID: Kayla Austin, female   DOB: 05/08/1932, 81 y.o.   MRN: [id number]  HPI patient presents stating I'm having a lot of pain on the inside of my right ankle and my foot is somewhat flat and there is inflammation and fluid buildup associated with it. It's been going on for several months   Review of Systems  All other systems reviewed and are negative.      Objective:   Physical Exam  Constitutional: She is oriented to person, place, and time.  Cardiovascular: Intact distal pulses.   Musculoskeletal: Normal range of motion.  Neurological: She is oriented to person, place, and time.  Skin: Skin is warm and dry.  Nursing note and vitals reviewed.  neurovascular status was found to be intact with patient having moderate depression of the arch bilateral and inflammation with the tendon complex right over left around the posterior tibial. I did not note muscle strength loss and the tendon appears to be functioning well and there is fluid buildup noted. Patient does have moderate discomfort on the outside of the foot but it is low grade in nature and is found to have good digital perfusion and is well oriented 3     Assessment:     Tendinitis right posterior tibial tendon with moderate depression of the arch    Plan:     H&P condition reviewed with patient and today I did a careful sheath injection right 3 mg Kenalog 5 mg Xylocaine and applied fascial brace to lift the arch up. Reappoint to recheck

## 2015-09-12 NOTE — Progress Notes (Signed)
   Subjective:    Patient ID: Kayla Austin, female    DOB: 09/15/1932, 81 y.o.   MRN: [id number]  HPI Patient presents with foot and ankle pain in their right foot. Right foot; top of foot; ankle pain-medial side; x2 weeks.  Patient also presents with a nail problem in their right foot; pinky toe; nail discoloration & thickened nail.   Review of Systems  All other systems reviewed and are negative.      Objective:   Physical Exam        Assessment & Plan:

## 2015-09-26 ENCOUNTER — Encounter: Payer: Self-pay | Admitting: Podiatry

## 2015-09-26 ENCOUNTER — Ambulatory Visit (INDEPENDENT_AMBULATORY_CARE_PROVIDER_SITE_OTHER): Payer: Medicare Other | Admitting: Podiatry

## 2015-09-26 VITALS — BP 148/66 | HR 69 | Resp 16

## 2015-09-26 DIAGNOSIS — M779 Enthesopathy, unspecified: Secondary | ICD-10-CM | POA: Diagnosis not present

## 2015-09-26 NOTE — Progress Notes (Signed)
Subjective:     Patient ID: Kayla Austin, female   DOB: 08/02/1932, 81 y.o.   MRN: [id number]  HPI patient states my foot is feeling a lot better and I'm walking normally   Review of Systems     Objective:   Physical Exam Neurovascular status intact with diminished inflammation in the right posterior tibial tendon with good alignment noted and good muscle strength    Assessment:     Tendinitis improving right    Plan:     Reevaluated muscle strength found to be adequate and advised on continued compression and utilization of brace. Continue supportive shoes and will be seen back as needed

## 2015-11-11 DIAGNOSIS — I739 Peripheral vascular disease, unspecified: Secondary | ICD-10-CM

## 2015-11-11 DIAGNOSIS — I70229 Atherosclerosis of native arteries of extremities with rest pain, unspecified extremity: Secondary | ICD-10-CM

## 2015-11-11 DIAGNOSIS — I998 Other disorder of circulatory system: Secondary | ICD-10-CM

## 2015-11-11 NOTE — H&P (Signed)
OFFICE VISIT NOTES COPIED TO EPIC FOR DOCUMENTATION Kayla Austin 11/24/2015 8:49 AM Location: [facility] Patient #: [id number] DOB: 30-Aug-1932 Separated / Language: Kayla Austin / Race: Black or African American Female   History of Present Illness Kayla Page MD; 24-Nov-2015 2:01 PM) Patient words: Last O/V 10/05/2015; F/U LE Doppler, Lab results.  The patient is a 82 year old female who presents for a follow-up for Leg claudication. I had seen her one month ago, for follow-up of atrial fibrillation. She has chronic atrial fibrillation. She also has hypertension and GERD. On her last office visit she complained of severe cramping in both the lower extremities and inability to perform day-to-day activities without having to rest. No rest pain or loose discoloration of feet. She underwent lower [place] arterial duplex on 10/16/2015 which revealed severely decreased ABI on the right and moderately decreased ABI on the left. She now presents here for follow-up.  No new symptoms. No bluish discoloration or non-healing ulcerations. Otherwise, she denies any chest pain, shortness of breath, PND, orthopnea, edema, syncope, or symptoms suggestive of TIA. She is tolerating all medications well without bleeding diathesis on [name].   Problem List/Past Medical Kayla Austin; Nov 24, 2015 1:27 PM) Shortness of breath on exertion (R06.02) Essential hypertension (I10) Chronic atrial fibrillation (I48.2) Lexiscan stress 09/10/12: Prominent breast attenuation artifact noted in the anteroseptal region. There is no evidence of ischemia by polar plot images, however a small sized septal scar cannot be completely excluded. This defect could also be due to presence of left bundle branch block. Overall a low risk study. Echo 09/02/12: Normal LVEF, mod biatrial enlargement. Mild TR. Left ventricular cavity is normal in size. Normal global wall motion. Normal systolic global function. Calculated EF 56%.  No pulmonary hypertension. Cardioversionon 10/05/2012 successful, but reverted to A. Fibrillation 2 weeks later. Gastroesophageal reflux disease without esophagitis (K21.9) History of colonic polyps (Z86.010) 2009 2 polyps removed by Dr. Benson Norway. No GIB history. Hyperlipidemia, group A (E78.00) Claudication, intermittent (I73.9) LE arterial duplex 11/28/2013: 1. Significant velocity increase at right SFA-mid. Moderate velocity increase at right CFA. This exam reveals mildly decreased perfusion of the right lower extremity, noted at the post tibial artery level. 2. Left iliac velocity suggests probable > 50% stenosis and left mid SFA shows < 50% stenosis. This exam reveals mildly decreased perfusion of the left lower extremity, noted at the post tibial artery level with ABI of 0.93 bilaterally. Clinical correlation recommended. Compared to the study done on 04/04/2013, right ABI improved from 0.53 and left ABI improved from 0.67. Overall this represents an improvement in perfusion to bilateral lower extremities. Hypertensive kidney disease with chronic kidney disease stage III (I12.9) Weakness (R53.1) Long term current use of anticoagulant (Z79.01) Anemia, unspecified type (D64.9) Arthritis (M19.90) History of diverticulitis of colon (Z87.19)01/25/2013 Follows Dr. Carol Ada.  Allergies (Kayla Austin; Nov 24, 2015 1:27 PM) Ampicillin *PENICILLINS* Hives.  Family History Kayla Austin; 2015/11/24 1:27 PM) Mother Deceased. at age 45 from Alzheimer's;hx of hypertension. Father Deceased. No knowledge of death. No known medical history. Siblings 8 siblings (3 living).  Social History Kayla Austin; November 24, 2015 1:27 PM) Current tobacco use Never smoker. Marital status Separated. Living Situation Lives alone. Number of Children 5. 4 living Alcohol Use Occasional alcohol use. minimal  Past Surgical History (Kayla Austin; 24-Nov-2015 1:27  PM) Hysterectomy1973 Cholecystectomy2000 Colectomy10/23/2015 Partial for diverticulitis  Medication History (Kayla Austin; 11/24/2015 1:36 PM) Metoprolol Succinate ER ('25MG'$  Tablet ER 24HR, 1 (one) Tablet ER 24HR Tablet Oral at bedtime, Taken starting 06/27/2015)  Active. Eliquis ('5MG'$  Tablet, 1 (one) Tablet Tablet Tablet Oral two times daily, Taken starting 06/14/2015) Active. Losartan Potassium ('25MG'$  Tablet, 1 (one) Tablet Tablet Tablet Oral daily, Taken starting 03/28/2015) Active. Atorvastatin Calcium ('10MG'$  Tablet, 1 (one) Tablet Tablet Tablet Oral daily, Taken starting 03/28/2015) Active. AmLODIPine Besylate ('5MG'$  Tablet, 1 (one) Tablet Tablet Oral daily, Taken starting 03/28/2015) Active. Hydrochlorothiazide ('25MG'$  Tablet, 1 Oral Daily) Active. NexIUM ('40MG'$  Capsule DR, 1 Oral two times daily) Active. Vitamin D3 (1000UNIT Capsule, 1 Oral daily) Active. Medications Reconciled (verbally wth patient/no list or medication present)  Diagnostic Studies History (Kayla Austin, AGNP-C; 10/31/2015 12:29 PM) Worthy Keeler 10/05/2015: total cholesterol 188, triglycerides 63, HDL 72, LDL 103, creatinine 1.09, CMP normal, CBC normal 03/28/2015: CBC normal, iron saturation mildly decreased at 13%, TIBC normal, UIBC normal, serum iron normal, serum ferritin normal, transferrin saturation decreased at 10% 11/23/2014: total cholesterol 134, triglycerides 56, HDL 48, LDL 75, TSH 0.742, HB 10.3/HCT 32.1 with mircrocytic indicies, creatinine 0.94, CMP normal 08/28/2014: HB 9.4/HCT 28.6. RDW 17.2. BMP 08/25/2014: Potassium 2.8, BUN 4, serum creatinine 1.01. EGFR 60 mL. 03/31/2014: Total cholesterol 130, triglycerides 66, HDL 41, LDL 76. LDL particle #[id number]. Lipid profile is within normal limits. CBC was normal, CMP revealed blood sugar of 102 mg, BUN 13, serum creatinine 1.18 with eGFR 73m. Otherwise CMP was within normal limits. No significant change from 03/02/2012. 05/20/2013: Total cholesterol 167, triglycerides  93, HDL 48, LDL 100. LDL particle number moderately elevated at 1558. CMP reveals blood sugar of 90, BUN 26, serum creatinine 1.25 with eGFR 48 mL. CMP otherwise normal. Lower Extremity Dopplers12/20/2016 Dampened waveform noted throughout the right lower extremity from the proximal SFA suggests severe diffuse disease. Biphasic wavefroms throuth the left lower extremity at the level of the distal SFA suggests diffuse disease. This exam reveals severely decreased perfusion of the right lower extremity, with RABI 0.49 moderately decreased perfusion of the left lower extremity with LABI 0.55, noted at the post tibial artery level. Clinical correlation is recommended and consider further work-up.    Review of Systems (Kayla Page MD; 10/31/2015 2:13 PM) General Not Present- Fever, Tiredness, Weight Gain > 10lbs. and Weight Loss > 10lbs..Marland KitchenHEENT Not Present- Blurred Vision and Headache. Respiratory Present- Decreased Exercise Tolerance and Difficulty Breathing on Exertion (chronic). Cardiovascular Present- Claudications. Not Present- Chest Pain, Edema, Fainting, Orthopnea, Palpitations and Paroxysmal Nocturnal Dyspnea. Gastrointestinal Present- Heartburn (Takes PPI and Zantac). Not Present- Black, Tarry Stool, Bloody Stool and Constipation. Musculoskeletal Present- Muscle Weakness (both legs on walking.). Neurological Not Present- Fainting, Focal Neurological Symptoms, Unsteadiness and Weakness. Endocrine Not Present- Cold Intolerance, Excessive Thirst and Hot flashes. Hematology Not Present- Abnormal Bleeding, Anemia, Blood Clots and Easy Bruising.  Vitals (Kayla MachoReader; 10/31/2015 1:34 PM) 10/31/2015 1:29 PM Weight: 192.19 lb Height: 63in Body Surface Area: 1.9 m Body Mass Index: 34.04 kg/m  Pulse: 100 (Irregular)  P.OX: 99% (Room air) BP: 148/72 (Sitting, Left Arm, Standard)       Physical Exam (Kayla PageMD; 10/31/2015 2:02 PM) General Mental  Status-Alert. General Appearance-Cooperative, Appears younger than stated age, Not in acute distress. Orientation-Oriented X3. Build & Nutrition-Moderately built and Moderately obese.  Head and Neck Thyroid Gland Characteristics - no palpable nodules, no palpable enlargement.  Chest and Lung Exam Chest and lung exam reveals -on auscultation, normal breath sounds, no adventitious sounds and normal vocal resonance. Palpation Tender - No chest wall tenderness.  Cardiovascular Cardiovascular examination reveals -normal heart sounds, regular rate and rhythm with no murmurs. Inspection Jugular  vein - Right - No Distention. Auscultation  Abdomen Palpation/Percussion Normal exam - Non Tender and No hepatosplenomegaly. Auscultation Normal exam - Bowel sounds normal.  Peripheral Vascular Lower Extremity Inspection - Left - No Pigmentation, No Varicose veins. Right - No Pigmentation, No Varicose veins. Palpation - Edema - Left - No edema. Right - No edema. Femoral pulse - Bilateral - 2+ and Bruit. Popliteal pulse - Bilateral - Absent. Dorsalis pedis pulse - Bilateral - Absent. Posterior tibial pulse - Bilateral - Absent. Carotid arteries - Left-No Carotid bruit. Carotid arteries - Right-No Carotid bruit. Abdomen-No prominent abdominal aortic pulsation, No epigastric bruit.  Neurologic Motor-Grossly intact without any focal deficits.  Musculoskeletal Global Assessment Left Lower Extremity - normal range of motion without pain. Right Lower Extremity - normal range of motion without pain.  Assessment & Plan Kayla Page MD; 10/31/2015 8:21 PM) Claudication, intermittent (I73.9) Story: Lower extremity arterial duplex 10/16/2015: Dampened waveform noted throughout the right lower extremity from the proximal SFA suggests severe diffuse disease. Biphasic wavefroms throuth the left lower extremity at the level of the distal SFA suggests diffuse disease. This exam  reveals severely decreased perfusion of the right lower extremity, with RABI 0.49 moderately decreased perfusion of the left lower extremity with LABI 0.55, noted at the post tibial artery level. Clinical correlation is recommended and consider further work-up. Compared to 11/28/2013 bilateral ABI previously was 0.93. Compared to the study done on 04/04/2013, right ABI 0.53 and left ABI 0.67. Current Plans Pt Education - Peripheral Artery Disease: claudication Pt Education - How to access health information online: discussed with patient and provided information. Future Plans 02/02/8118: METABOLIC PANEL, BASIC (14782) - one time Contraindication to antiplatelet therapy (Z53.09) Story: Patient on Eliquis. Bleeding risk Chronic atrial fibrillation (I48.2) CHA2DS2-VASc Score is 5 with yearly risk of stroke of 6.7 %.   Story: Lexiscan stress 09/10/12: Prominent breast attenuation artifact noted in the anteroseptal region. There is no evidence of ischemia by polar plot images, however a small sized septal scar cannot be completely excluded. This defect could also be due to presence of left bundle branch block. Overall a low risk study.  Echo 09/02/12: Normal LVEF, mod biatrial enlargement. Mild TR. Left ventricular cavity is normal in size. Normal global wall motion. Normal systolic global function. Calculated EF 56%. No pulmonary hypertension.  Cardioversion on 10/05/2012 successful, but reverted to A. Fibrillation 2 weeks later. Impression: EKG 10/05/2015: Atrial fibrillation with controlled ventricular response at a rate of 74 bpm, normal axis, anterior infarct old, diffuse nonspecific T-wave abnormality. No significant change from EKG on 04/05/2015. Future Plans 11/08/2015: CBC & PLATELETS (AUTO) [phone]) - one time 11/08/2015: PT (PROTHROMBIN TIME) (30865) - one time  Labwork Story: 10/05/2015: total cholesterol 188, triglycerides 63, HDL 72, LDL 103, creatinine 1.09, CMP normal, CBC normal  03/28/2015: CBC  normal, iron saturation mildly decreased at 13%, TIBC normal, UIBC normal, serum iron normal, serum ferritin normal, transferrin saturation decreased at 10%  11/23/2014: total cholesterol 134, triglycerides 56, HDL 48, LDL 75, TSH 0.742, HB 10.3/HCT 32.1 with mircrocytic indicies, creatinine 0.94, CMP normal  08/28/2014: HB 9.4/HCT 28.6. RDW 17.2. BMP 08/25/2014: Potassium 2.8, BUN 4, serum creatinine 1.01. EGFR 60 mL.  03/31/2014: Total cholesterol 130, triglycerides 66, HDL 41, LDL 76. LDL particle #1097. Lipid profile is within normal limits. CBC was normal, CMP revealed blood sugar of 102 mg, BUN 13, serum creatinine 1.18 with eGFR 35m. Otherwise CMP was within normal limits. No significant change from 03/02/2012.  05/20/2013:  Total cholesterol 167, triglycerides 93, HDL 48, LDL 100. LDL particle number moderately elevated at 1558. CMP reveals blood sugar of 90, BUN 26, serum creatinine 1.25 with eGFR 48 mL. CMP otherwise normal. Hypertensive kidney disease with chronic kidney disease stage III (I12.9) Hyperlipidemia, group A (E78.00)   Current Plans Mechanism of underlying disease process and action of medications discussed with the patient. I discussed primary/secondary prevention and also dietary counseling was done. She presents here for a four-week office visit and follow-up of peripheral arterial disease, lower extremity arterial duplex revealing severely decreased diffusion to the right lower extremity which is symptomatic. I discussed with her regarding risks benefits and alternatives, due to worsening claudication and also worsening ABI I have recommended proceeding with peripheral angiography. We will hold losartan and HCTZ at least 3 days prior to procedure. Admit one day prior for hydration. I will see her back after the procedure and make further recommendations. With regard to atrial fibrillation she is on long-term anticoagulation with Eliquis which will be held 1 day prior.   Signed by  Kayla Page, MD (10/31/2015 8:23 PM)

## 2015-11-15 ENCOUNTER — Observation Stay (HOSPITAL_COMMUNITY)
Admission: RE | Admit: 2015-11-15 | Discharge: 2015-11-16 | Disposition: A | Discharge status: Home / Self Care | Payer: Medicare Other | Source: Ambulatory Visit | Attending: Cardiology | Admitting: Cardiology

## 2015-11-15 DIAGNOSIS — I482 Chronic atrial fibrillation: Secondary | ICD-10-CM | POA: Diagnosis not present

## 2015-11-15 DIAGNOSIS — N183 Chronic kidney disease, stage 3 (moderate): Secondary | ICD-10-CM | POA: Insufficient documentation

## 2015-11-15 DIAGNOSIS — I70213 Atherosclerosis of native arteries of extremities with intermittent claudication, bilateral legs: Principal | ICD-10-CM | POA: Insufficient documentation

## 2015-11-15 DIAGNOSIS — I129 Hypertensive chronic kidney disease with stage 1 through stage 4 chronic kidney disease, or unspecified chronic kidney disease: Secondary | ICD-10-CM | POA: Diagnosis not present

## 2015-11-15 DIAGNOSIS — E7800 Pure hypercholesterolemia, unspecified: Secondary | ICD-10-CM | POA: Insufficient documentation

## 2015-11-15 DIAGNOSIS — N189 Chronic kidney disease, unspecified: Secondary | ICD-10-CM

## 2015-11-15 HISTORY — DX: Chronic kidney disease, unspecified: N18.9

## 2015-11-15 LAB — CBC
HCT: 40.4 % (ref 36.0–46.0)
HEMOGLOBIN: 12.9 g/dL (ref 12.0–15.0)
MCH: 27.3 pg (ref 26.0–34.0)
MCHC: 31.9 g/dL (ref 30.0–36.0)
MCV: 85.6 fL (ref 78.0–100.0)
Platelets: 265 10*3/uL (ref 150–400)
RBC: 4.72 MIL/uL (ref 3.87–5.11)
RDW: 14.4 % (ref 11.5–15.5)
WBC: 8 10*3/uL (ref 4.0–10.5)

## 2015-11-15 LAB — BASIC METABOLIC PANEL
ANION GAP: 12 (ref 5–15)
BUN: 32 mg/dL — ABNORMAL HIGH (ref 6–20)
CHLORIDE: 104 mmol/L (ref 101–111)
CO2: 25 mmol/L (ref 22–32)
Calcium: 9.5 mg/dL (ref 8.9–10.3)
Creatinine, Ser: 1.38 mg/dL — ABNORMAL HIGH (ref 0.44–1.00)
GFR calc non Af Amer: 35 mL/min — ABNORMAL LOW (ref >60)
GFR, EST AFRICAN AMERICAN: 40 mL/min — AB (ref >60)
Glucose, Bld: 99 mg/dL (ref 65–99)
Potassium: 4.1 mmol/L (ref 3.5–5.1)
Sodium: 141 mmol/L (ref 135–145)

## 2015-11-15 MED ORDER — ASPIRIN EC 81 MG PO TBEC
81.0000 mg | DELAYED_RELEASE_TABLET | Freq: Every day | ORAL | Status: DC
  Administered 2015-11-15: 81 mg via ORAL
  Filled 2015-11-15: qty 1

## 2015-11-15 MED ORDER — ACETAMINOPHEN 325 MG PO TABS
650.0000 mg | ORAL_TABLET | ORAL | Status: DC | PRN
  Filled 2015-11-15: qty 2

## 2015-11-15 MED ORDER — SODIUM CHLORIDE 0.9 % IV SOLN
INTRAVENOUS | Status: DC
  Administered 2015-11-15: 21:00:00 via INTRAVENOUS

## 2015-11-15 MED ORDER — SODIUM BICARBONATE BOLUS VIA INFUSION
INTRAVENOUS | Status: AC
  Administered 2015-11-16: 1 meq via INTRAVENOUS
  Filled 2015-11-15: qty 1

## 2015-11-15 MED ORDER — SODIUM BICARBONATE 8.4 % IV SOLN
INTRAVENOUS | Status: DC
  Filled 2015-11-15: qty 1000

## 2015-11-15 MED ORDER — ONDANSETRON HCL 4 MG/2ML IJ SOLN: 4.0000 mg | Freq: Four times a day (QID) | INTRAMUSCULAR | Status: DC | PRN

## 2015-11-15 NOTE — Progress Notes (Signed)
Direct admit for vascular procedure in the AM. Alert and oriented x 4, denies pain or complaints. Oriented to the room, w/o questions.

## 2015-11-16 ENCOUNTER — Ambulatory Visit (HOSPITAL_COMMUNITY): Admission: RE | Admit: 2015-11-16 | Payer: Medicare Other | Source: Ambulatory Visit | Admitting: Cardiology

## 2015-11-16 ENCOUNTER — Encounter (HOSPITAL_COMMUNITY)
Admission: RE | Disposition: A | Discharge status: Home / Self Care | Payer: Self-pay | Source: Ambulatory Visit | Attending: Cardiology

## 2015-11-16 ENCOUNTER — Encounter (HOSPITAL_COMMUNITY): Payer: Self-pay | Admitting: Cardiology

## 2015-11-16 DIAGNOSIS — I70213 Atherosclerosis of native arteries of extremities with intermittent claudication, bilateral legs: Secondary | ICD-10-CM | POA: Diagnosis not present

## 2015-11-16 HISTORY — PX: PERIPHERAL VASCULAR CATHETERIZATION: SHX172C

## 2015-11-16 LAB — BASIC METABOLIC PANEL
ANION GAP: 11 (ref 5–15)
BUN: 30 mg/dL — ABNORMAL HIGH (ref 6–20)
CHLORIDE: 109 mmol/L (ref 101–111)
CO2: 20 mmol/L — AB (ref 22–32)
Calcium: 8.9 mg/dL (ref 8.9–10.3)
Creatinine, Ser: 1.1 mg/dL — ABNORMAL HIGH (ref 0.44–1.00)
GFR calc non Af Amer: 46 mL/min — ABNORMAL LOW (ref >60)
GFR, EST AFRICAN AMERICAN: 53 mL/min — AB (ref >60)
Glucose, Bld: 96 mg/dL (ref 65–99)
POTASSIUM: 4.3 mmol/L (ref 3.5–5.1)
Sodium: 140 mmol/L (ref 135–145)

## 2015-11-16 LAB — POCT ACTIVATED CLOTTING TIME
ACTIVATED CLOTTING TIME: 250 s
ACTIVATED CLOTTING TIME: 224 s
Activated Clotting Time: 183 seconds

## 2015-11-16 SURGERY — LOWER EXTREMITY ANGIOGRAPHY
Anesthesia: LOCAL

## 2015-11-16 MED ORDER — FENTANYL CITRATE (PF) 100 MCG/2ML IJ SOLN
INTRAMUSCULAR | Status: AC
  Filled 2015-11-16: qty 2

## 2015-11-16 MED ORDER — HEPARIN SODIUM (PORCINE) 1000 UNIT/ML IJ SOLN
INTRAMUSCULAR | Status: AC
  Filled 2015-11-16: qty 1

## 2015-11-16 MED ORDER — HYDRALAZINE HCL 20 MG/ML IJ SOLN
INTRAMUSCULAR | Status: AC
  Filled 2015-11-16: qty 1

## 2015-11-16 MED ORDER — LIDOCAINE HCL (PF) 1 % IJ SOLN
INTRAMUSCULAR | Status: DC | PRN
  Administered 2015-11-16: 09:00:00

## 2015-11-16 MED ORDER — LOSARTAN POTASSIUM 25 MG PO TABS: 25.0000 mg | ORAL_TABLET | Freq: Every day | ORAL | Status: DC

## 2015-11-16 MED ORDER — ELIQUIS 5 MG PO TABS: 5.0000 mg | ORAL_TABLET | Freq: Two times a day (BID) | ORAL | Status: DC

## 2015-11-16 MED ORDER — AMLODIPINE BESYLATE 10 MG PO TABS: 10.0000 mg | ORAL_TABLET | Freq: Every day | ORAL | Status: DC

## 2015-11-16 MED ORDER — IODIXANOL 320 MG/ML IV SOLN
INTRAVENOUS | Status: DC | PRN
  Administered 2015-11-16: 135 mL via INTRA_ARTERIAL

## 2015-11-16 MED ORDER — HEPARIN SODIUM (PORCINE) 1000 UNIT/ML IJ SOLN
INTRAMUSCULAR | Status: DC | PRN
  Administered 2015-11-16: 8000 [IU] via INTRAVENOUS

## 2015-11-16 MED ORDER — LABETALOL HCL 5 MG/ML IV SOLN: 15.0000 mg | Freq: Once | INTRAVENOUS | Status: DC

## 2015-11-16 MED ORDER — MIDAZOLAM HCL 2 MG/2ML IJ SOLN
INTRAMUSCULAR | Status: AC
  Filled 2015-11-16: qty 2

## 2015-11-16 MED ORDER — HYDRALAZINE HCL 20 MG/ML IJ SOLN
10.0000 mg | Freq: Once | INTRAMUSCULAR | Status: AC
  Administered 2015-11-16: 10 mg via INTRAVENOUS

## 2015-11-16 MED ORDER — AMLODIPINE BESYLATE 10 MG PO TABS
10.0000 mg | ORAL_TABLET | Freq: Every day | ORAL | Status: DC
  Filled 2015-11-16: qty 1

## 2015-11-16 MED ORDER — MIDAZOLAM HCL 2 MG/2ML IJ SOLN
INTRAMUSCULAR | Status: DC | PRN
  Administered 2015-11-16: 2 mg via INTRAVENOUS

## 2015-11-16 MED ORDER — HEPARIN (PORCINE) IN NACL 2-0.9 UNIT/ML-% IJ SOLN
INTRAMUSCULAR | Status: AC
  Filled 2015-11-16: qty 1000

## 2015-11-16 MED ORDER — LIDOCAINE HCL (PF) 1 % IJ SOLN
INTRAMUSCULAR | Status: AC
  Filled 2015-11-16: qty 30

## 2015-11-16 MED ORDER — FENTANYL CITRATE (PF) 100 MCG/2ML IJ SOLN
INTRAMUSCULAR | Status: DC | PRN
  Administered 2015-11-16: 25 ug via INTRAVENOUS

## 2015-11-16 SURGICAL SUPPLY — 15 items
CATH OMNI FLUSH 5F 65CM (CATHETERS) ×3 IMPLANT
CATH QUICKCROSS .018X135CM (MICROCATHETER) ×3 IMPLANT
GUIDEWIRE REGALIA .014X300CM (WIRE) ×3 IMPLANT
KIT ENCORE 26 ADVANTAGE (KITS) ×3 IMPLANT
KIT MICROINTRODUCER STIFF 5F (SHEATH) ×3 IMPLANT
KIT PV (KITS) ×3 IMPLANT
SHEATH HIGHFLEX ANSEL 7FR 55CM (SHEATH) ×3 IMPLANT
SHEATH PINNACLE 5F 10CM (SHEATH) ×3 IMPLANT
SHEATH PINNACLE 7F 10CM (SHEATH) ×3 IMPLANT
SYRINGE MEDRAD AVANTA MACH 7 (SYRINGE) ×3 IMPLANT
TRANSDUCER W/STOPCOCK (MISCELLANEOUS) ×3 IMPLANT
TRAY PV CATH (CUSTOM PROCEDURE TRAY) ×3 IMPLANT
TUBING CIL FLEX 10 FLL-RA (TUBING) ×3 IMPLANT
WIRE HITORQ VERSACORE ST 145CM (WIRE) ×3 IMPLANT
WIRE TREASURE-12 .018X300CM (WIRE) ×3 IMPLANT

## 2015-11-16 NOTE — Care Management Obs Status (Signed)
MEDICARE OBSERVATION STATUS NOTIFICATION   Patient Details  Name: Kayla Austin MRN: [id number] Date of Birth: 1932/08/12   Medicare Observation Status Notification Given:  Yes    Cherylann Parr, RN 11/16/2015, 1:56 PM

## 2015-11-16 NOTE — Progress Notes (Signed)
MD paged about pt BP 172/76. No new orders given. Will continue to monitor closely.  Sandrea Hammond RN

## 2015-11-16 NOTE — Interval H&P Note (Signed)
History and Physical Interval Note:  11/16/2015 6:33 AM  Kayla Austin  has presented today for surgery, with the diagnosis of PVD  The various methods of treatment have been discussed with the patient and family. After consideration of risks, benefits and other options for treatment, the patient has consented to  Procedure(s): Lower Extremity Angiography (Bilateral) and possible angioplasty as a surgical intervention .  The patient's history has been reviewed, patient examined, no change in status, stable for surgery.  I have reviewed the patient's chart and labs.  Questions were answered to the patient's satisfaction.     [name]

## 2015-11-16 NOTE — Discharge Summary (Signed)
Physician Discharge Summary  Patient ID: Kayla Austin MRN: [id number] DOB/AGE: 1932-03-27 82 y.o.  Admit date: 11/15/2015 Discharge date: 11/16/2015  Primary Discharge Diagnosis Peripheral arterial disease with severe critical limb ischemia right lower extremity, ABI in 0.4 range, moderate decrease in ABI left lower extremity. Bilateral SFA disease.  Secondary Discharge Diagnosis Hypertension Chronic stage III kidney disease Chronic atrial fibrillation  Significant Diagnostic Studies: Peripheral arteriogram 11/16/2015: Abdominal aortogram: Mild atherosclerotic changes of the abdominal aorta. Mild diffuse atherosclerotic changes of the bilateral renal arteries without high-grade stenosis. No evidence of abdominal Aneurysm. Aortoiliac bifurcation widely patent. Bilateral iliac vessels and common, internal and external iliac vessels showed very minimal disease.  Right femoral arteriogram with distal runoff: Right SFA flush occluded. Reconstitutes just outside of the Juma's canal, there is a short segment CTO in the proximal and followed by multiple high-grade lesions between. Below the right knee there is three-vessel runoff. No significant disease is evident.  Left femoral arteriogram distal runoff: Left SFA shows scattered high-grade 80-90% stenosis in the midsegment. There is two-vessel runoff below the left knee in the form of peroneal which is the dominant vessel and mildly diffusely diseased posterior tibial vessel. Anterior tibial vessels reconstitute at the level of the ankle via collaterals.  Recommendation: Patient will need revascularization of the right SFA, retrograde access is a viable option for optimal results. I may consider transferring her care to Dr. Hoy Finlay at [facility].  Left SFA is amenable for atherectomy, this can be scheduled electively at a later date.  Hospital Course: Patient admitted electively for hydration and peripheral arteriogram. Underwent the  procedure without any complications, 130 mL of contrast was utilized for diagnostic angiography, patient felt stable for discharge after the procedure with outpatient follow-up.  Discharge Exam: Blood pressure 172/105, pulse 72, temperature 97.5 F (36.4 C), temperature source Oral, resp. rate 8, height  (1.6 m), weight 84.823 kg (187 lb), SpO2 0 %.  General appearance: alert, cooperative, appears stated age, no distress and moderately obese Resp: clear to auscultation bilaterally Cardio: irregularly irregular rhythm, S1: variable intensity and S2: normal, 2/6 SEM in aortic area GI: soft, non-tender; bowel sounds normal; no masses,  no organomegaly Extremities: extremities normal, atraumatic, no cyanosis or edema Pulses: Absent pedal pulses Labs:   Lab Results  Component Value Date   WBC 8.0 11/15/2015   HGB 12.9 11/15/2015   HCT 40.4 11/15/2015   MCV 85.6 11/15/2015   PLT 265 11/15/2015    Recent Labs Lab 11/16/15 0345  NA 140  K 4.3  CL 109  CO2 20*  BUN 30*  CREATININE 1.10*  CALCIUM 8.9  GLUCOSE 96   FOLLOW UP PLANS AND APPOINTMENTS    Medication List    STOP taking these medications        ibuprofen 200 MG tablet  Commonly known as:  ADVIL,MOTRIN      TAKE these medications        atorvastatin 10 MG tablet  Commonly known as:  LIPITOR  Take 10 mg by mouth 2 (two) times daily.     cholecalciferol 1000 units tablet  Commonly known as:  VITAMIN D  Take 1,000 Units by mouth daily as needed (for supplementation).     ELIQUIS 5 MG Tabs tablet  Generic drug:  apixaban  Take 1 tablet (5 mg total) by mouth 2 (two) times daily.  Start taking on:  11/17/2015     hydrochlorothiazide 25 MG tablet  Commonly known as:  HYDRODIURIL  Take 25 mg by mouth every morning.     losartan 25 MG tablet  Commonly known as:  COZAAR  Take 1 tablet (25 mg total) by mouth daily at 12 noon.  Start taking on:  11/19/2015     metoprolol succinate 25 MG 24 hr tablet   Commonly known as:  TOPROL-XL  Take 25 mg by mouth at bedtime.           Follow-up Information    Follow up with [name], MD.   Specialty:  Cardiology   Why:  Keep previuos appointment   Contact information:   [address] [place] [address] [phone]        [name], MD 11/16/2015, 8:56 AM  Pager: [phone] Office: [phone] If no answer: [phone]

## 2015-11-16 NOTE — Progress Notes (Signed)
Order for sheath removal verified per post procedural orders. Procedure explained to patient and Lt femoral artery access site assessed: level 0, dopplered dorsalis pedis and posterior tibial pulses. 7 [place] Sheath removed and manual pressure applied for 25 minutes. Pre, peri, & post procedural vitals: HR 84, RR 20, O2 Sat upper 99, BP 153/51, Pain 0. Distal pulses remained intact after sheath removal. Access site level 0 and dressed with 4X4 gauze and tegaderm.  Delana Meyer, RN confirmed condition of site. Post procedural instructions discussed with return demonstration from patient.

## 2015-11-16 NOTE — H&P (View-Only) (Signed)
OFFICE VISIT NOTES COPIED TO EPIC FOR DOCUMENTATION Kayla Austin 11/17/2015 8:49 AM Location: [facility] Patient #: [id number] DOB: 1932-09-09 Separated / Language: [name] / Race: Black or African American Female   History of Present Illness Kayla Page MD; 11/17/2015 2:01 PM) Patient words: Last O/V 10/05/2015; F/U LE Doppler, Lab results.  The patient is a 82 year old female who presents for a follow-up for Leg claudication. I had seen her one month ago, for follow-up of atrial fibrillation. She has chronic atrial fibrillation. She also has hypertension and GERD. On her last office visit she complained of severe cramping in both the lower extremities and inability to perform day-to-day activities without having to rest. No rest pain or loose discoloration of feet. She underwent lower Ixodes arterial duplex on 10/16/2015 which revealed severely decreased ABI on the right and moderately decreased ABI on the left. She now presents here for follow-up.  No new symptoms. No bluish discoloration or non-healing ulcerations. Otherwise, she denies any chest pain, shortness of breath, PND, orthopnea, edema, syncope, or symptoms suggestive of TIA. She is tolerating all medications well without bleeding diathesis on [place].   Problem List/Past Medical [name]; 11/17/2015 1:27 PM) Shortness of breath on exertion (R06.02) Essential hypertension (I10) Chronic atrial fibrillation (I48.2) Lexiscan stress 09/10/12: Prominent breast attenuation artifact noted in the anteroseptal region. There is no evidence of ischemia by polar plot images, however a small sized septal scar cannot be completely excluded. This defect could also be due to presence of left bundle branch block. Overall a low risk study. Echo 09/02/12: Normal LVEF, mod biatrial enlargement. Mild TR. Left ventricular cavity is normal in size. Normal global wall motion. Normal systolic global function. Calculated EF 56%.  No pulmonary hypertension. Cardioversionon 10/05/2012 successful, but reverted to A. Fibrillation 2 weeks later. Gastroesophageal reflux disease without esophagitis (K21.9) History of colonic polyps (Z86.010) 2009 2 polyps removed by Dr. Benson Norway. No GIB history. Hyperlipidemia, group A (E78.00) Claudication, intermittent (I73.9) LE arterial duplex 11/28/2013: 1. Significant velocity increase at right SFA-mid. Moderate velocity increase at right CFA. This exam reveals mildly decreased perfusion of the right lower extremity, noted at the post tibial artery level. 2. Left iliac velocity suggests probable > 50% stenosis and left mid SFA shows < 50% stenosis. This exam reveals mildly decreased perfusion of the left lower extremity, noted at the post tibial artery level with ABI of 0.93 bilaterally. Clinical correlation recommended. Compared to the study done on 04/04/2013, right ABI improved from 0.53 and left ABI improved from 0.67. Overall this represents an improvement in perfusion to bilateral lower extremities. Hypertensive kidney disease with chronic kidney disease stage III (I12.9) Weakness (R53.1) Long term current use of anticoagulant (Z79.01) Anemia, unspecified type (D64.9) Arthritis (M19.90) History of diverticulitis of colon (Z87.19)01/25/2013 Follows Dr. Carol Ada.  Allergies ([name]; 2015-11-17 1:27 PM) Ampicillin *PENICILLINS* Hives.  Family History [name]; 2015/11/17 1:27 PM) Mother Deceased. at age 82 from Alzheimer's;hx of hypertension. Father Deceased. No knowledge of death. No known medical history. Siblings 8 siblings (3 living).  Social History [name]; 11-17-15 1:27 PM) Current tobacco use Never smoker. Marital status Separated. Living Situation Lives alone. Number of Children 5. 4 living Alcohol Use Occasional alcohol use. minimal  Past Surgical History ([name]; 11-17-2015 1:27  PM) Hysterectomy1973 Cholecystectomy2000 Colectomy10/23/2015 Partial for diverticulitis  Medication History ([name]; 2015/11/17 1:36 PM) Metoprolol Succinate ER ('25MG'$  Tablet ER 24HR, 1 (one) Tablet ER 24HR Tablet Oral at bedtime, Taken starting 06/27/2015)  Active. Eliquis ('5MG'$  Tablet, 1 (one) Tablet Tablet Tablet Oral two times daily, Taken starting 06/14/2015) Active. Losartan Potassium ('25MG'$  Tablet, 1 (one) Tablet Tablet Tablet Oral daily, Taken starting 03/28/2015) Active. Atorvastatin Calcium ('10MG'$  Tablet, 1 (one) Tablet Tablet Tablet Oral daily, Taken starting 03/28/2015) Active. AmLODIPine Besylate ('5MG'$  Tablet, 1 (one) Tablet Tablet Oral daily, Taken starting 03/28/2015) Active. Hydrochlorothiazide ('25MG'$  Tablet, 1 Oral Daily) Active. NexIUM ('40MG'$  Capsule DR, 1 Oral two times daily) Active. Vitamin D3 (1000UNIT Capsule, 1 Oral daily) Active. Medications Reconciled (verbally wth patient/no list or medication present)  Diagnostic Studies History ([name], AGNP-C; 10/31/2015 12:29 PM) Worthy Keeler 10/05/2015: total cholesterol 188, triglycerides 63, HDL 72, LDL 103, creatinine 1.09, CMP normal, CBC normal 03/28/2015: CBC normal, iron saturation mildly decreased at 13%, TIBC normal, UIBC normal, serum iron normal, serum ferritin normal, transferrin saturation decreased at 10% 11/23/2014: total cholesterol 134, triglycerides 56, HDL 48, LDL 75, TSH 0.742, HB 10.3/HCT 32.1 with mircrocytic indicies, creatinine 0.94, CMP normal 08/28/2014: HB 9.4/HCT 28.6. RDW 17.2. BMP 08/25/2014: Potassium 2.8, BUN 4, serum creatinine 1.01. EGFR 60 mL. 03/31/2014: Total cholesterol 130, triglycerides 66, HDL 41, LDL 76. LDL particle #[id number]. Lipid profile is within normal limits. CBC was normal, CMP revealed blood sugar of 102 mg, BUN 13, serum creatinine 1.18 with eGFR 18m. Otherwise CMP was within normal limits. No significant change from 03/02/2012. 05/20/2013: Total cholesterol 167, triglycerides  93, HDL 48, LDL 100. LDL particle number moderately elevated at 1558. CMP reveals blood sugar of 90, BUN 26, serum creatinine 1.25 with eGFR 48 mL. CMP otherwise normal. Lower Extremity Dopplers12/20/2016 Dampened waveform noted throughout the right lower extremity from the proximal SFA suggests severe diffuse disease. Biphasic wavefroms throuth the left lower extremity at the level of the distal SFA suggests diffuse disease. This exam reveals severely decreased perfusion of the right lower extremity, with RABI 0.49 moderately decreased perfusion of the left lower extremity with LABI 0.55, noted at the post tibial artery level. Clinical correlation is recommended and consider further work-up.    Review of Systems (Kayla Page MD; 10/31/2015 2:13 PM) General Not Present- Fever, Tiredness, Weight Gain > 10lbs. and Weight Loss > 10lbs..Marland KitchenHEENT Not Present- Blurred Vision and Headache. Respiratory Present- Decreased Exercise Tolerance and Difficulty Breathing on Exertion (chronic). Cardiovascular Present- Claudications. Not Present- Chest Pain, Edema, Fainting, Orthopnea, Palpitations and Paroxysmal Nocturnal Dyspnea. Gastrointestinal Present- Heartburn (Takes PPI and Zantac). Not Present- Black, Tarry Stool, Bloody Stool and Constipation. Musculoskeletal Present- Muscle Weakness (both legs on walking.). Neurological Not Present- Fainting, Focal Neurological Symptoms, Unsteadiness and Weakness. Endocrine Not Present- Cold Intolerance, Excessive Thirst and Hot flashes. Hematology Not Present- Abnormal Bleeding, Anemia, Blood Clots and Easy Bruising.  Vitals (Franky MachoReader; 10/31/2015 1:34 PM) 10/31/2015 1:29 PM Weight: 192.19 lb Height: 63in Body Surface Area: 1.9 m Body Mass Index: 34.04 kg/m  Pulse: 100 (Irregular)  P.OX: 99% (Room air) BP: 148/72 (Sitting, Left Arm, Standard)       Physical Exam (Kayla PageMD; 10/31/2015 2:02 PM) General Mental  Status-Alert. General Appearance-Cooperative, Appears younger than stated age, Not in acute distress. Orientation-Oriented X3. Build & Nutrition-Moderately built and Moderately obese.  Head and Neck Thyroid Gland Characteristics - no palpable nodules, no palpable enlargement.  Chest and Lung Exam Chest and lung exam reveals -on auscultation, normal breath sounds, no adventitious sounds and normal vocal resonance. Palpation Tender - No chest wall tenderness.  Cardiovascular Cardiovascular examination reveals -normal heart sounds, regular rate and rhythm with no murmurs. Inspection Jugular  vein - Right - No Distention. Auscultation  Abdomen Palpation/Percussion Normal exam - Non Tender and No hepatosplenomegaly. Auscultation Normal exam - Bowel sounds normal.  Peripheral Vascular Lower Extremity Inspection - Left - No Pigmentation, No Varicose veins. Right - No Pigmentation, No Varicose veins. Palpation - Edema - Left - No edema. Right - No edema. Femoral pulse - Bilateral - 2+ and Bruit. Popliteal pulse - Bilateral - Absent. Dorsalis pedis pulse - Bilateral - Absent. Posterior tibial pulse - Bilateral - Absent. Carotid arteries - Left-No Carotid bruit. Carotid arteries - Right-No Carotid bruit. Abdomen-No prominent abdominal aortic pulsation, No epigastric bruit.  Neurologic Motor-Grossly intact without any focal deficits.  Musculoskeletal Global Assessment Left Lower Extremity - normal range of motion without pain. Right Lower Extremity - normal range of motion without pain.  Assessment & Plan Kayla Page MD; 10/31/2015 8:21 PM) Claudication, intermittent (I73.9) Story: Lower extremity arterial duplex 10/16/2015: Dampened waveform noted throughout the right lower extremity from the proximal SFA suggests severe diffuse disease. Biphasic wavefroms throuth the left lower extremity at the level of the distal SFA suggests diffuse disease. This exam  reveals severely decreased perfusion of the right lower extremity, with RABI 0.49 moderately decreased perfusion of the left lower extremity with LABI 0.55, noted at the post tibial artery level. Clinical correlation is recommended and consider further work-up. Compared to 11/28/2013 bilateral ABI previously was 0.93. Compared to the study done on 04/04/2013, right ABI 0.53 and left ABI 0.67. Current Plans Pt Education - Peripheral Artery Disease: claudication Pt Education - How to access health information online: discussed with patient and provided information. Future Plans 06/05/1750: METABOLIC PANEL, BASIC (02585) - one time Contraindication to antiplatelet therapy (Z53.09) Story: Patient on Eliquis. Bleeding risk Chronic atrial fibrillation (I48.2) CHA2DS2-VASc Score is 5 with yearly risk of stroke of 6.7 %.   Story: Lexiscan stress 09/10/12: Prominent breast attenuation artifact noted in the anteroseptal region. There is no evidence of ischemia by polar plot images, however a small sized septal scar cannot be completely excluded. This defect could also be due to presence of left bundle branch block. Overall a low risk study.  Echo 09/02/12: Normal LVEF, mod biatrial enlargement. Mild TR. Left ventricular cavity is normal in size. Normal global wall motion. Normal systolic global function. Calculated EF 56%. No pulmonary hypertension.  Cardioversion on 10/05/2012 successful, but reverted to A. Fibrillation 2 weeks later. Impression: EKG 10/05/2015: Atrial fibrillation with controlled ventricular response at a rate of 74 bpm, normal axis, anterior infarct old, diffuse nonspecific T-wave abnormality. No significant change from EKG on 04/05/2015. Future Plans 11/08/2015: CBC & PLATELETS (AUTO) [phone]) - one time 11/08/2015: PT (PROTHROMBIN TIME) (42353) - one time  Labwork Story: 10/05/2015: total cholesterol 188, triglycerides 63, HDL 72, LDL 103, creatinine 1.09, CMP normal, CBC normal  03/28/2015: CBC  normal, iron saturation mildly decreased at 13%, TIBC normal, UIBC normal, serum iron normal, serum ferritin normal, transferrin saturation decreased at 10%  11/23/2014: total cholesterol 134, triglycerides 56, HDL 48, LDL 75, TSH 0.742, HB 10.3/HCT 32.1 with mircrocytic indicies, creatinine 0.94, CMP normal  08/28/2014: HB 9.4/HCT 28.6. RDW 17.2. BMP 08/25/2014: Potassium 2.8, BUN 4, serum creatinine 1.01. EGFR 60 mL.  03/31/2014: Total cholesterol 130, triglycerides 66, HDL 41, LDL 76. LDL particle #1097. Lipid profile is within normal limits. CBC was normal, CMP revealed blood sugar of 102 mg, BUN 13, serum creatinine 1.18 with eGFR 67m. Otherwise CMP was within normal limits. No significant change from 03/02/2012.  05/20/2013:  Total cholesterol 167, triglycerides 93, HDL 48, LDL 100. LDL particle number moderately elevated at 1558. CMP reveals blood sugar of 90, BUN 26, serum creatinine 1.25 with eGFR 48 mL. CMP otherwise normal. Hypertensive kidney disease with chronic kidney disease stage III (I12.9) Hyperlipidemia, group A (E78.00)   Current Plans Mechanism of underlying disease process and action of medications discussed with the patient. I discussed primary/secondary prevention and also dietary counseling was done. She presents here for a four-week office visit and follow-up of peripheral arterial disease, lower extremity arterial duplex revealing severely decreased diffusion to the right lower extremity which is symptomatic. I discussed with her regarding risks benefits and alternatives, due to worsening claudication and also worsening ABI I have recommended proceeding with peripheral angiography. We will hold losartan and HCTZ at least 3 days prior to procedure. Admit one day prior for hydration. I will see her back after the procedure and make further recommendations. With regard to atrial fibrillation she is on long-term anticoagulation with Eliquis which will be held 1 day prior.   Signed by  Kayla Page, MD (10/31/2015 8:23 PM)

## 2015-11-16 NOTE — Progress Notes (Signed)
Patient given discharge information and follow-up appointments. IV taken out. No questions or concerns

## 2015-11-22 ENCOUNTER — Other Ambulatory Visit: Payer: Self-pay

## 2015-11-22 DIAGNOSIS — Z1231 Encounter for screening mammogram for malignant neoplasm of breast: Secondary | ICD-10-CM

## 2015-12-12 ENCOUNTER — Ambulatory Visit: Payer: Medicare Other

## 2016-02-01 ENCOUNTER — Ambulatory Visit: Payer: Medicare Other

## 2016-02-09 ENCOUNTER — Encounter (HOSPITAL_COMMUNITY): Payer: Self-pay | Admitting: Emergency Medicine

## 2016-02-09 ENCOUNTER — Emergency Department (HOSPITAL_COMMUNITY)
Admission: EM | Admit: 2016-02-09 | Discharge: 2016-02-09 | Disposition: A | Discharge status: Home / Self Care | Payer: Medicare Other | Attending: Emergency Medicine | Admitting: Emergency Medicine

## 2016-02-09 DIAGNOSIS — I2510 Atherosclerotic heart disease of native coronary artery without angina pectoris: Secondary | ICD-10-CM | POA: Diagnosis not present

## 2016-02-09 DIAGNOSIS — Y9289 Other specified places as the place of occurrence of the external cause: Secondary | ICD-10-CM | POA: Diagnosis not present

## 2016-02-09 DIAGNOSIS — M1990 Unspecified osteoarthritis, unspecified site: Secondary | ICD-10-CM | POA: Insufficient documentation

## 2016-02-09 DIAGNOSIS — H05012 Cellulitis of left orbit: Secondary | ICD-10-CM | POA: Diagnosis not present

## 2016-02-09 DIAGNOSIS — Y998 Other external cause status: Secondary | ICD-10-CM | POA: Insufficient documentation

## 2016-02-09 DIAGNOSIS — R220 Localized swelling, mass and lump, head: Secondary | ICD-10-CM | POA: Diagnosis present

## 2016-02-09 DIAGNOSIS — Y9389 Activity, other specified: Secondary | ICD-10-CM | POA: Insufficient documentation

## 2016-02-09 DIAGNOSIS — Z8709 Personal history of other diseases of the respiratory system: Secondary | ICD-10-CM | POA: Diagnosis not present

## 2016-02-09 DIAGNOSIS — N183 Chronic kidney disease, stage 3 (moderate): Secondary | ICD-10-CM | POA: Diagnosis not present

## 2016-02-09 DIAGNOSIS — Z79899 Other long term (current) drug therapy: Secondary | ICD-10-CM | POA: Insufficient documentation

## 2016-02-09 DIAGNOSIS — I129 Hypertensive chronic kidney disease with stage 1 through stage 4 chronic kidney disease, or unspecified chronic kidney disease: Secondary | ICD-10-CM | POA: Insufficient documentation

## 2016-02-09 DIAGNOSIS — E785 Hyperlipidemia, unspecified: Secondary | ICD-10-CM | POA: Diagnosis not present

## 2016-02-09 DIAGNOSIS — I499 Cardiac arrhythmia, unspecified: Secondary | ICD-10-CM | POA: Insufficient documentation

## 2016-02-09 DIAGNOSIS — T7840XA Allergy, unspecified, initial encounter: Secondary | ICD-10-CM | POA: Insufficient documentation

## 2016-02-09 DIAGNOSIS — X58XXXA Exposure to other specified factors, initial encounter: Secondary | ICD-10-CM | POA: Insufficient documentation

## 2016-02-09 DIAGNOSIS — R011 Cardiac murmur, unspecified: Secondary | ICD-10-CM | POA: Diagnosis not present

## 2016-02-09 DIAGNOSIS — L03213 Periorbital cellulitis: Secondary | ICD-10-CM

## 2016-02-09 MED ORDER — HYDROXYZINE HCL 10 MG PO TABS: 10.0000 mg | ORAL_TABLET | Freq: Four times a day (QID) | ORAL | Status: DC | PRN

## 2016-02-09 MED ORDER — FLUORESCEIN SODIUM 1 MG OP STRP
1.0000 | ORAL_STRIP | Freq: Once | OPHTHALMIC | Status: AC
  Administered 2016-02-09: 1 via OPHTHALMIC
  Filled 2016-02-09: qty 1

## 2016-02-09 MED ORDER — CLINDAMYCIN HCL 150 MG PO CAPS: 300.0000 mg | ORAL_CAPSULE | Freq: Three times a day (TID) | ORAL | Status: DC

## 2016-02-09 MED ORDER — PREDNISONE 10 MG PO TABS: 40.0000 mg | ORAL_TABLET | Freq: Every day | ORAL | Status: AC

## 2016-02-09 MED ORDER — FAMOTIDINE 20 MG PO TABS
20.0000 mg | ORAL_TABLET | Freq: Once | ORAL | Status: AC
  Administered 2016-02-09: 20 mg via ORAL
  Filled 2016-02-09: qty 1

## 2016-02-09 MED ORDER — PREDNISONE 20 MG PO TABS
60.0000 mg | ORAL_TABLET | Freq: Once | ORAL | Status: AC
  Administered 2016-02-09: 60 mg via ORAL
  Filled 2016-02-09: qty 3

## 2016-02-09 MED ORDER — DIPHENHYDRAMINE HCL 25 MG PO CAPS
25.0000 mg | ORAL_CAPSULE | Freq: Once | ORAL | Status: AC
  Administered 2016-02-09: 25 mg via ORAL
  Filled 2016-02-09: qty 1

## 2016-02-09 MED ORDER — TETRACAINE HCL 0.5 % OP SOLN
1.0000 [drp] | Freq: Once | OPHTHALMIC | Status: AC
  Administered 2016-02-09: 1 [drp] via OPHTHALMIC
  Filled 2016-02-09: qty 2

## 2016-02-09 NOTE — ED Provider Notes (Signed)
CSN: [id number]     Arrival date & time 02/09/16  0857 History   First MD Initiated Contact with Patient 02/09/16 [phone]     Chief Complaint  Patient presents with  . Facial Swelling  . Eye Problem     (Consider location/radiation/quality/duration/timing/severity/associated sxs/prior Treatment) HPI   Kayla Austin is a 82 y/o AAF, with hx of HTN, CAD, HLD, A fib, CKD stage 3, she presents the emergency room with complaints of left eye and left cheek swelling that began last night when her eyes became itchy with her "seasonal allergies."  She states that it woke her up at approximately 3 AM and continued to be itchy and she took 2 Benadryl. This morning when she woke up it was more profoundly swollen with clear drainage. She denies any purulent drainage, eye was not crusted shut.  Swelling of left face is somewhat hard and mildly red, she denies tenderness, but describes it as "irritated and swollen."  She denies eye pain, blurry vision, photophobia, double vision, fever, chills, sweats, nausea, vomiting, swelling of lips, SOB, wheeze, CP, HA.  She previously used allergy eyedrops but ran out.   She has no other complaints at this time. She states that she did not take her morning medications including her metoprolol.    Past Medical History  Diagnosis Date  . Hypertension   . GERD (gastroesophageal reflux disease)   . Arthritis   . Coronary artery disease   . Hyperlipemia   . Shortness of breath     on exertion  . History of colon polyps   . Dysrhythmia     a fib  . Heart murmur     hx of   . Bronchitis     2009  . Chronic kidney disease     stage 3   Past Surgical History  Procedure Laterality Date  . Abdominal hysterectomy    . Cholecystectomy    . Cardioversion  10/05/2012    Procedure: CARDIOVERSION;  Surgeon: Pamella PertJagadeesh R Ganji, MD;  [facility];  Service: Cardiovascular;  Laterality: N/A;  . Flexible sigmoidoscopy N/A 06/09/2014    Procedure: FLEXIBLE  SIGMOIDOSCOPY;  Surgeon: Theda BelfastPatrick D Hung, MD;  [facility];  Service: Endoscopy;  Laterality: N/A;  . Appendectomy    . Carpal tunnel release      right hand  . Peripheral vascular catheterization Bilateral 11/16/2015    Procedure: Lower Extremity Angiography;  Surgeon: Yates DecampJay Ganji, MD;  [facility];  Service: Cardiovascular;  Laterality: Bilateral;  . Peripheral vascular catheterization N/A 11/16/2015    Procedure: Abdominal Aortogram;  Surgeon: Yates DecampJay Ganji, MD;  [facility];  Service: Cardiovascular;  Laterality: N/A;   Family History  Problem Relation Age of Onset  . Cancer Sister    Social History  Substance Use Topics  . Smoking status: Never Smoker   . Smokeless tobacco: Never Used  . Alcohol Use: Yes     Comment: occasional   OB History    No data available     Review of Systems  Constitutional: Negative.  Negative for fever, chills, diaphoresis, activity change and fatigue.  HENT: Positive for facial swelling. Negative for congestion, dental problem, ear pain, rhinorrhea, sinus pressure and sore throat.   Eyes: Positive for discharge (clear) and itching. Negative for photophobia, pain, redness and visual disturbance.  Gastrointestinal: Negative.   Musculoskeletal: Negative.   Allergic/Immunologic: Positive for environmental allergies. Negative for food allergies and immunocompromised state.  Neurological:  Negative.   Psychiatric/Behavioral: Negative.   All other systems reviewed and are negative.     Allergies  Ampicillin  Home Medications   Prior to Admission medications   Medication Sig Start Date End Date Taking? Authorizing Provider  amLODipine (NORVASC) 10 MG tablet Take 1 tablet (10 mg total) by mouth daily. 11/16/15  Yes [name], MD  atorvastatin (LIPITOR) 10 MG tablet Take 10 mg by mouth 2 (two) times daily.    Yes Historical Provider, MD  cholecalciferol (VITAMIN D) 1000 units tablet Take 1,000 Units by mouth daily  as needed (for supplementation).   Yes Historical Provider, MD  ELIQUIS 5 MG TABS tablet Take 1 tablet (5 mg total) by mouth 2 (two) times daily. 11/17/15  Yes [name], MD  hydrochlorothiazide (HYDRODIURIL) 25 MG tablet Take 25 mg by mouth every morning.    Yes Historical Provider, MD  losartan (COZAAR) 25 MG tablet Take 1 tablet (25 mg total) by mouth daily at 12 noon. 11/19/15  Yes [name], MD  metoprolol succinate (TOPROL-XL) 25 MG 24 hr tablet Take 25 mg by mouth at bedtime. 10/02/15  Yes Historical Provider, MD  clindamycin (CLEOCIN) 150 MG capsule Take 2 capsules (300 mg total) by mouth 3 (three) times daily. May dispense as  capsules 02/09/16   Danelle Berry, PA-C  hydrOXYzine (ATARAX/VISTARIL) 10 MG tablet Take 1 tablet (10 mg total) by mouth every 6 (six) hours as needed for itching. 02/09/16   Danelle Berry, PA-C  predniSONE (DELTASONE) 10 MG tablet Take 4 tablets (40 mg total) by mouth daily. Take 40 mg (four tabs) by mouth for 2 days , then 20 mg (two tabs) by mouth for 2 days, then 10 mg (1 tab) by mouth for 2 days 02/10/16 02/16/16  Danelle Berry, PA-C   BP 157/72 mmHg  Pulse 65  Temp(Src) 98.1 F (36.7 C) (Oral)  Resp 18  SpO2 100% Physical Exam  Constitutional: She is oriented to person, place, and time. Vital signs are normal. She appears well-developed and well-nourished. She is cooperative.  Non-toxic appearance. She does not have a sickly appearance. No distress.  HENT:  Head: Normocephalic and atraumatic.    Right Ear: External ear normal.  Left Ear: External ear normal.  Nose: Nose normal. No sinus tenderness. Right sinus exhibits no maxillary sinus tenderness and no frontal sinus tenderness. Left sinus exhibits no maxillary sinus tenderness and no frontal sinus tenderness.  Mouth/Throat: Uvula is midline, oropharynx is clear and moist and mucous membranes are normal. Mucous membranes are not pale, not dry and not cyanotic. She has dentures. No trismus in the jaw. No uvula  swelling. No oropharyngeal exudate, posterior oropharyngeal edema or posterior oropharyngeal erythema.  Eyes: Conjunctivae and EOM are normal. Pupils are equal, round, and reactive to light. Right eye exhibits no discharge and no exudate. Left eye exhibits discharge (clear drainage). Left eye exhibits no exudate. Right conjunctiva is not injected. Right conjunctiva has no hemorrhage. Left conjunctiva is not injected. Left conjunctiva has no hemorrhage. No scleral icterus. Right eye exhibits normal extraocular motion. Left eye exhibits normal extraocular motion. Right pupil is round and reactive. Left pupil is round and reactive. Pupils are equal.      Visual Acuity  Right Eye Distance: 10 Left Eye Distance: 10 Bilateral Distance: 10  Right Eye Near: R Near: 10/20 Left Eye Near:  L Near: 10/20 Bilateral Near:  5   Neck: Normal range of motion. Neck supple. No JVD present. No tracheal deviation present.  Cardiovascular: Normal rate and intact distal pulses.  An irregularly irregular rhythm present.  Pulses:      Radial pulses are 2+ on the right side, and 2+ on the left side.  Pulmonary/Chest: Effort normal and breath sounds normal. No stridor. No respiratory distress. She has no wheezes.  Abdominal: Soft. She exhibits no distension.  Musculoskeletal: Normal range of motion. She exhibits no edema.  Lymphadenopathy:    She has no cervical adenopathy.  Neurological: She is alert and oriented to person, place, and time. She exhibits normal muscle tone. Coordination normal.  Skin: Skin is warm and dry. No rash noted. She is not diaphoretic. No erythema. No pallor.  Psychiatric: She has a normal mood and affect. Her behavior is normal. Judgment and thought content normal.  Nursing note and vitals reviewed.     ED Course  Procedures (including critical care time) Labs Review Labs Reviewed - No data to display  Imaging Review No results found. I have personally reviewed and evaluated  these images and lab results as part of my medical decision-making.   EKG Interpretation None      MDM   Pt with itchy left eye and subsequent swelling of left lower eyelid and left face.  On exam swelling appears to be very focal to the left lower eyelid and left cheek over the maxillary area, does not appear to have any ocular involvement, no conjunctivitis, no purulent discharge, no pain or abnormality with EOMs. Visual acuity is normal. No proptosis. No concern for orbital cellulitis.  More likely preseptal cellulitis vs allergies.  Case was discussed with Dr. Rosalia Hammers who is also personally seen and evaluated the patient.  She agrees with coverage with clindamycin given induration of cheek, although sx are mostly consistent with allergies.   She was given prednisone, Pepcid and Benadryl in the ER with small prednisone taper to take at home with low-dose hydroxyzine. In addition to clindamycin. She was encouraged to follow up with her PCP on Monday. Return precautions were reviewed, patient verbalizes understanding.  She was discharged in good condition stable vital signs.  Final diagnoses:  Periorbital cellulitis of left eye  Left facial swelling  Allergic reaction, initial encounter        Danelle Berry, PA-C 02/09/16 1111  Margarita Grizzle, MD 02/12/16 [phone]

## 2016-02-09 NOTE — ED Notes (Signed)
Pt arrived from home with cc of L eye swelling that began as itching last night. Pt went to bed last night, woke up around 0300 and L eye was itching, so she took two Benadryl. When she woke, her eye was very swollen. Reporting no loss of vision or exposure to any potentially harmful chemicals/substances to eye area, just seasonal allergies.

## 2016-02-09 NOTE — Discharge Instructions (Signed)
Preseptal Cellulitis, Adult Preseptal cellulitis--also called periorbital cellulitis--is an infection that can affect your eyelid and the soft tissues or skin that surround your eye. The infection may also affect the structures that produce and drain your tears. It does not affect your eye itself. CAUSES This condition may be caused by:  Bacterial infection.  Long-term (chronic) sinus infections.  An object (foreign body) that is stuck behind the eye.  An injury that:  Goes through the eyelid tissues.  Causes an infection, such as an insect sting.  Fracture of the bone around the eye.  Infections that have spread from the eyelid or other structures around the eye.  Bite wounds.  Inflammation or infection of the lining membranes of the brain (meningitis).  An infection in the blood (septicemia).  Dental infection (abscess).  Viral infection. This is rare. RISK FACTORS Risk factors for preseptal cellulitis include:  Participating in activities that increase your risk of trauma to the face or head, such as boxing or high-speed activities.  Having a weakened defense system (immune system).  Medical conditions, such as nasal polyps, that increase your risk for frequent or recurrent sinus infections.  Not receiving regular dental care. SYMPTOMS Symptoms of this condition usually come on suddenly. Symptoms may include:  Red, hot, and swollen eyelids.  Fever.  Difficulty opening your eye.  Eye pain. DIAGNOSIS This condition may be diagnosed by an eye exam. You may also have tests, such as:  Blood tests.  CT scan.  MRI.  Spinal tap (lumbar puncture). This is a procedure that involves removing and examining a small amount of the fluid that surrounds the brain and spinal cord. This checks for meningitis. TREATMENT Treatment for this condition will include antibiotic medicines. These may be given by mouth (orally), through an IV, or as a shot. Your health care  provider may also recommend nasal decongestants to reduce swelling. HOME CARE INSTRUCTIONS  Take your antibiotic medicine as directed by your health care provider. Finish all of it even if you start to feel better.  Take medicines only as directed by your health care provider.  Drink enough fluid to keep your urine clear or pale yellow.  Do not use any tobacco products, including cigarettes, chewing tobacco, or electronic cigarettes. If you need help quitting, ask your health care provider.  Keep all follow-up visits as directed by your health care provider. These include any visits with an eye specialist (ophthalmologist) or dentist. SEEK MEDICAL CARE IF:  You have a fever.  Your eyelids become more red, warm, or swollen.    You have new symptoms.  Your symptoms do not get better with treatment. SEEK IMMEDIATE MEDICAL CARE IF:  You develop double vision, or your vision becomes blurred or worsens in any way.  You have trouble moving your eyes.  Your eye looks like it is sticking out or bulging out (proptosis).  You develop a severe headache, severe neck pain, or neck stiffness.  You develop repeated vomiting.   This information is not intended to replace advice given to you by your health care provider. Make sure you discuss any questions you have with your health care provider. Document Released: 11/15/2010 Document Revised: 02/27/2015 Document Reviewed: 10/09/2014 Elsevier Interactive Patient Education 2016 [employer]   Allergies An allergy is an abnormal reaction to a substance by the body's defense system (immune system). Allergies can develop at any age. WHAT CAUSES ALLERGIES? An allergic reaction happens when the immune system mistakenly reacts to a normally harmless  substance, called an allergen, as if it were harmful. The immune system releases antibodies to fight the substance. Antibodies eventually release a chemical called histamine into the bloodstream. The  release of histamine is meant to protect the body from infection, but it also causes discomfort. An allergic reaction can be triggered by:  Eating an allergen.  Inhaling an allergen.  Touching an allergen. WHAT TYPES OF ALLERGIES ARE THERE? There are many types of allergies. Common types include:  Seasonal allergies. People with this type of allergy are usually allergic to substances that are only present during certain seasons, such as molds and pollens.  Food allergies.  Drug allergies.  Insect allergies.  Animal dander allergies. WHAT ARE SYMPTOMS OF ALLERGIES? Possible allergy symptoms include:  Swelling of the lips, face, tongue, mouth, or throat.  Sneezing, coughing, or wheezing.  Nasal congestion.  Tingling in the mouth.  Rash.  Itching.  Itchy, red, swollen areas of skin (hives).  Watery eyes.  Vomiting.  Diarrhea.  Dizziness.  Lightheadedness.  Fainting.  Trouble breathing or swallowing.  Chest tightness.  Rapid heartbeat. HOW ARE ALLERGIES DIAGNOSED? Allergies are diagnosed with a medical and family history and one or more of the following:  Skin tests.  Blood tests.  A food diary. A food diary is a record of all the foods and drinks you have in a day and of all the symptoms you experience.  The results of an elimination diet. An elimination diet involves eliminating foods from your diet and then adding them back in one by one to find out if a certain food causes an allergic reaction. HOW ARE ALLERGIES TREATED? There is no cure for allergies, but allergic reactions can be treated with medicine. Severe reactions usually need to be treated at a hospital. HOW CAN REACTIONS BE PREVENTED? The best way to prevent an allergic reaction is by avoiding the substance you are allergic to. Allergy shots and medicines can also help prevent reactions in some cases. People with severe allergic reactions may be able to prevent a life-threatening reaction  called anaphylaxis with a medicine given right after exposure to the allergen.   This information is not intended to replace advice given to you by your health care provider. Make sure you discuss any questions you have with your health care provider.   Document Released: 01/06/2003 Document Revised: 11/03/2014 Document Reviewed: 07/25/2014 Elsevier Interactive Patient Education [employer]

## 2016-02-14 ENCOUNTER — Encounter: Payer: Self-pay | Admitting: Podiatry

## 2016-02-14 ENCOUNTER — Ambulatory Visit (INDEPENDENT_AMBULATORY_CARE_PROVIDER_SITE_OTHER): Payer: Medicare Other | Admitting: Podiatry

## 2016-02-14 DIAGNOSIS — R600 Localized edema: Secondary | ICD-10-CM

## 2016-02-14 DIAGNOSIS — M779 Enthesopathy, unspecified: Secondary | ICD-10-CM

## 2016-02-17 NOTE — Progress Notes (Signed)
Subjective:     Patient ID: Kayla Austin, female   DOB: 09/30/1932, [age] y.o.   MRN: [id number]  HPI patient states she's been developing some swelling in her right foot and ankle and she was wondering if there's anything we can do   Review of Systems     Objective:   Physical Exam Neurovascular status was found to be adequate at the current time with feet warm and digital perfusion was found to be adequate with normal Fill time. Patient has moderate edema in the right ankle and foot with negative Homans sign noted and has +1 pitting edema in the midfoot right and into the ankle. It is localized in nature    Assessment:     Appears to be more of venous type swelling with +1 edema with what appears to be currently good arterial flow    Plan:     H&P and all conditions reviewed with patient. I discussed elevation and I applied a ankle compression stocking to try to help with swelling. I advised that if it's to start to get more swollen or she should develop any pain in her leg she needs to go to the emergency room or see her vascular doctor

## 2016-03-04 ENCOUNTER — Ambulatory Visit
Admission: RE | Admit: 2016-03-04 | Discharge: 2016-03-04 | Disposition: A | Discharge status: Home / Self Care | Payer: Medicare Other | Source: Ambulatory Visit

## 2016-03-04 DIAGNOSIS — Z1231 Encounter for screening mammogram for malignant neoplasm of breast: Secondary | ICD-10-CM

## 2016-09-06 IMAGING — MR MR ABDOMEN WO/W CM
10 of 18 series · 25 of 48 positions shown · IV contrast (16 ML MULTIHANCE)
Comparison: CT on 08/27/2014

CLINICAL DATA: Cystic pancreatic lesion seen on recent CT.

EXAM:
MRI ABDOMEN WITHOUT AND WITH CONTRAST
TECHNIQUE: Multiplanar multisequence MR imaging of the abdomen was performed
both before and after the administration of intravenous contrast.
CONTRAST:  16mL MULTIHANCE GADOBENATE DIMEGLUMINE 529 MG/ML IV SOLN

[Series 3: haste axial · axial · 5.0mm · 0.70mm/px · z∈[-19,+175]mm · 2 of 32 slices shown]
[im 1/32]
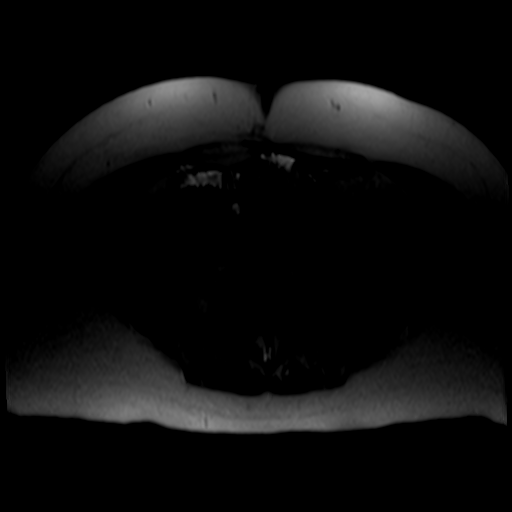
[im 32/32]
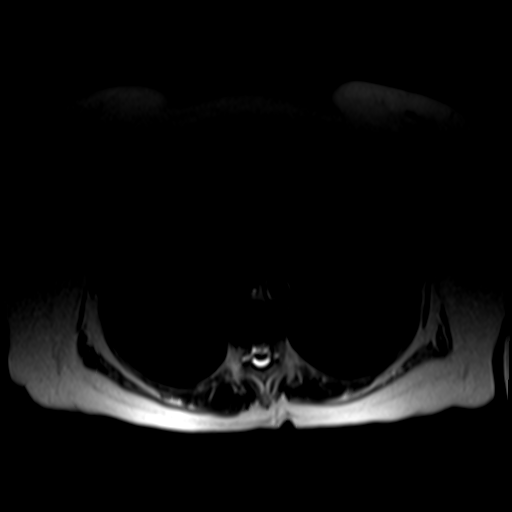

[Series 4: cor haste · coronal · 5.0mm · 0.70mm/px · 3 of 35 slices shown]
[im 1/35]
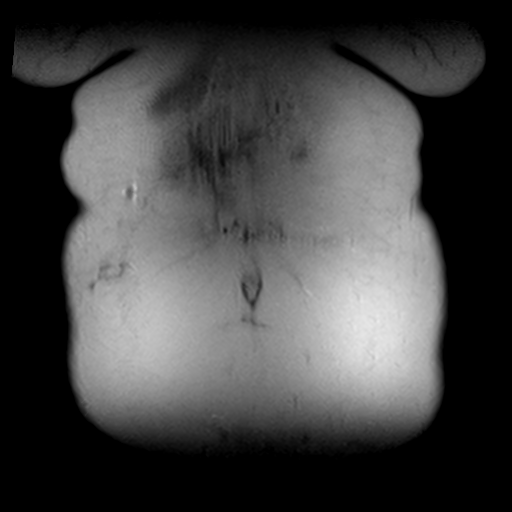
[im 18/35]
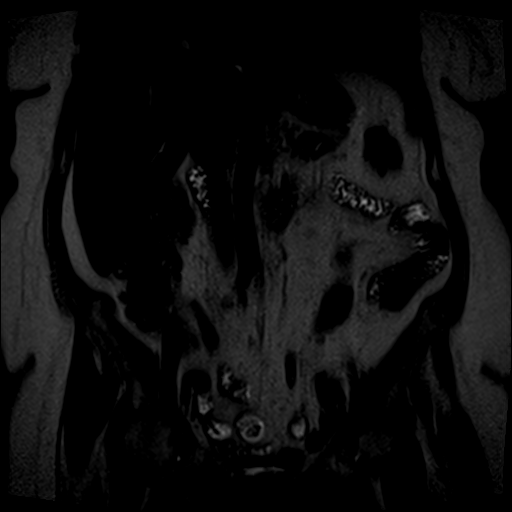
[im 35/35]
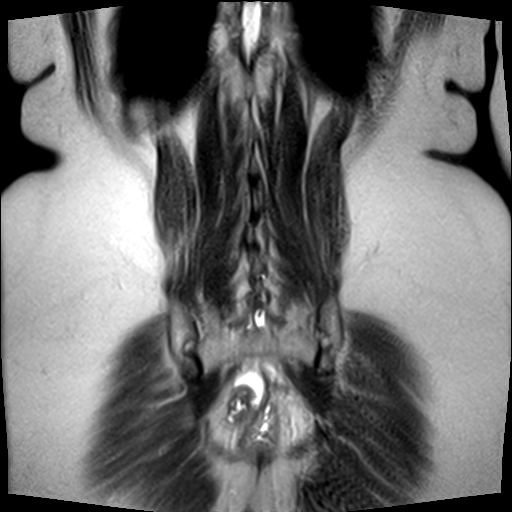

[Series 5: T2 · axial · 5.0mm · 0.94mm/px · z∈[+21,+215]mm · 2 of 32 slices shown]
[im 1/32]
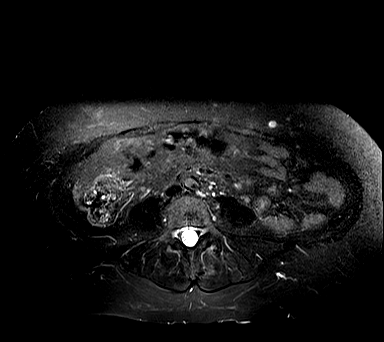
[im 32/32]
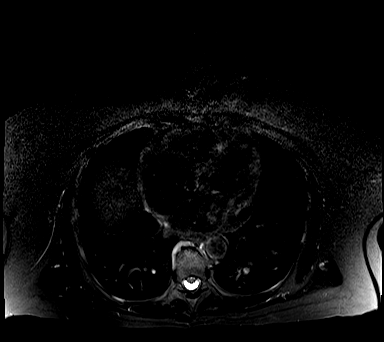

[Series 6: [id number]_trig · axial · 5.0mm · 1.88mm/px · z∈[+27,+209]mm · 4 of 90 slices shown]
[im 1/90]
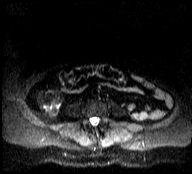
[im 30/90]
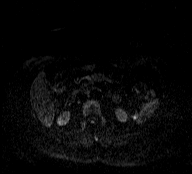
[im 60/90]
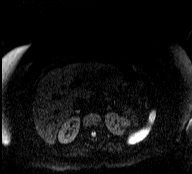
[im 90/90]
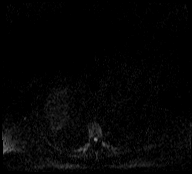

[Series 7: [id number]_[id number] · axial · 5.0mm · 1.88mm/px · 1 of 30 slices shown]
[im 1/30]
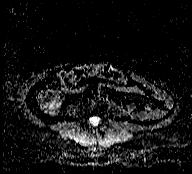

[Series 10: T1 · axial · 5.0mm · 0.70mm/px · z∈[-9,+165]mm · 3 of 60 slices shown]
[im 1/60]
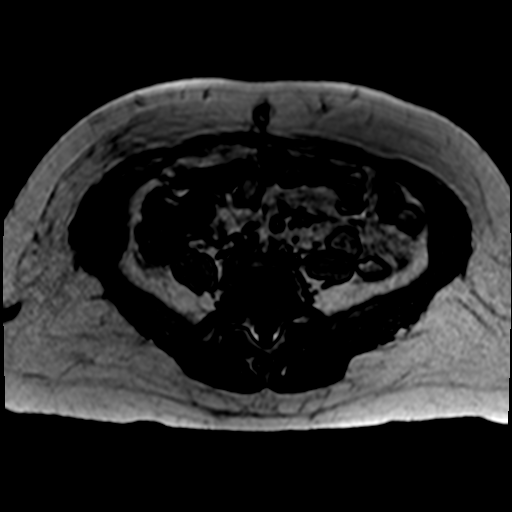
[im 30/60]
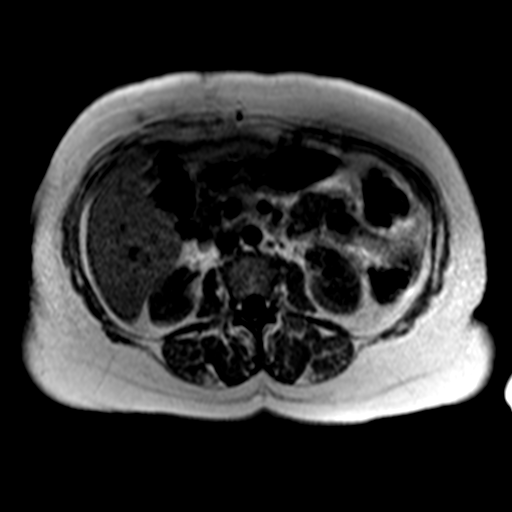
[im 60/60]
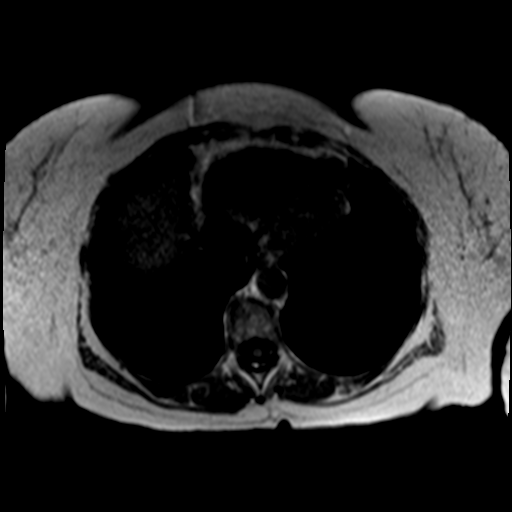

[Series 11: bSSFP · axial · 5.0mm · 0.70mm/px · 1 of 30 slices shown]
[im 1/30]
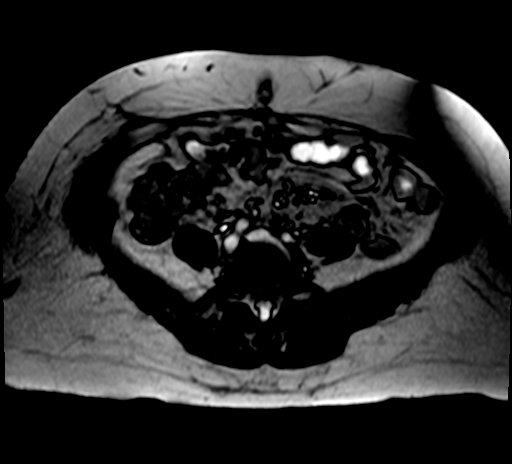

[Series 12: T1 dynamic · axial · non-contrast · 2.5mm · 0.70mm/px · z∈[-11,+166]mm · 3 of 72 slices shown]
[im 1/72]
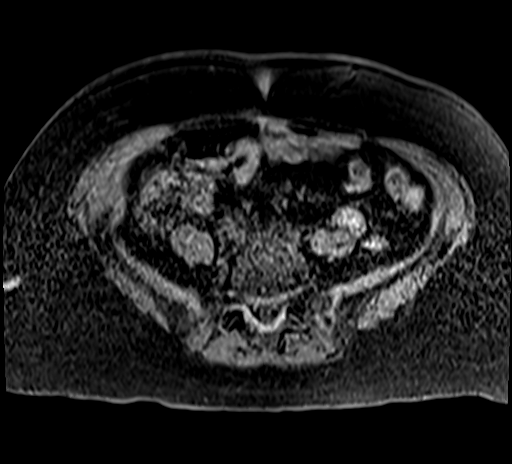
[im 36/72]
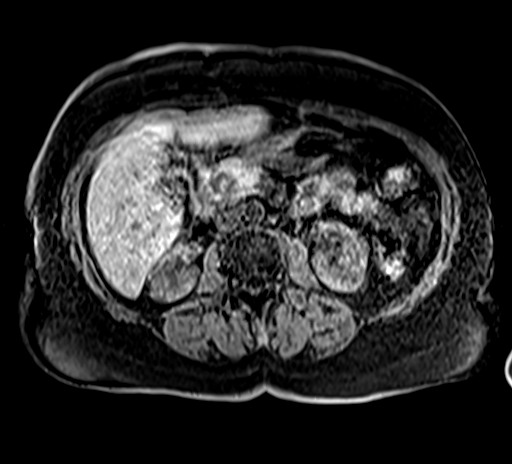
[im 72/72]
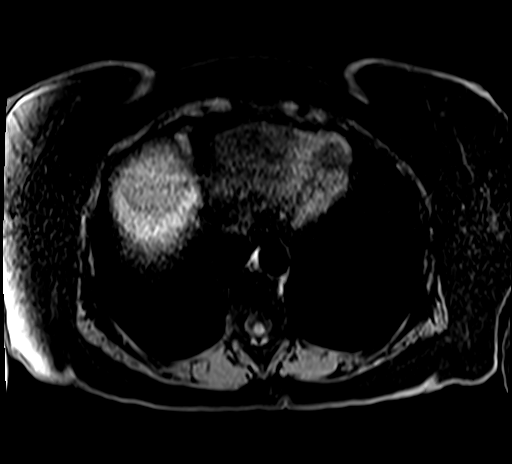

[Series 13: T1 dynamic post-contrast · axial · 2.5mm · 0.70mm/px · z∈[-11,+166]mm · 3 of 72 slices shown (1 of 2)]
[im 1/72]
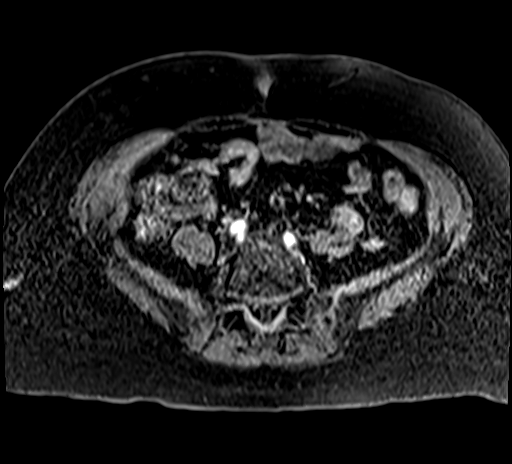
[im 36/72]
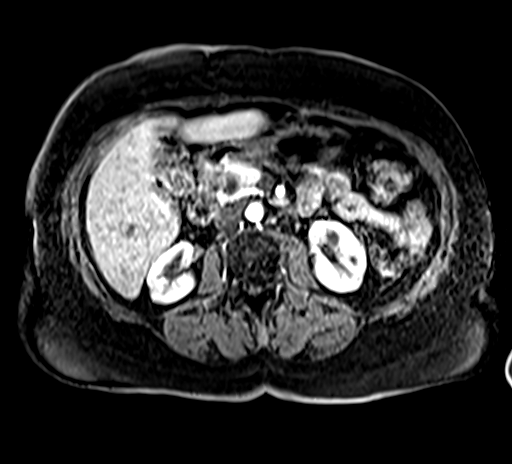
[im 72/72]
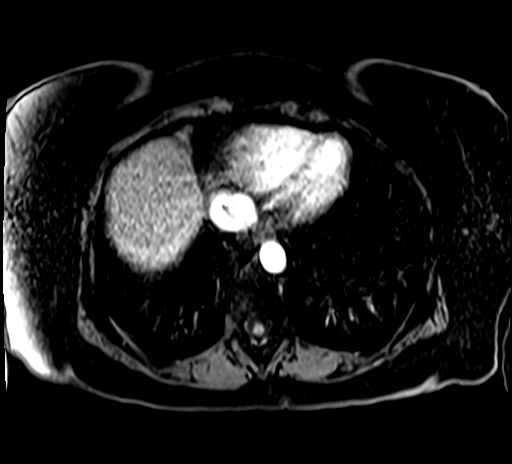

[Series 14: T1 dynamic post-contrast · axial · 2.5mm · 0.70mm/px · z∈[-11,+166]mm · 3 of 72 slices shown (2 of 2)]
[im 1/72]
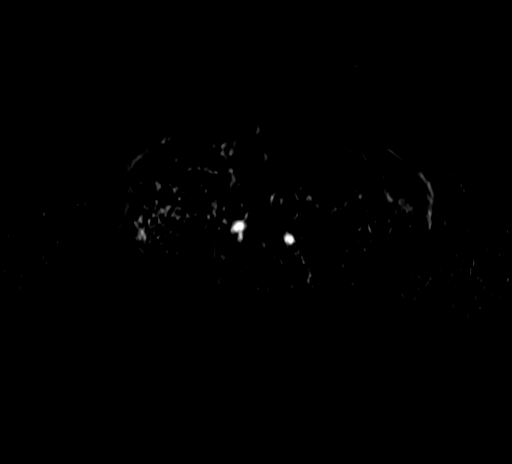
[im 36/72]
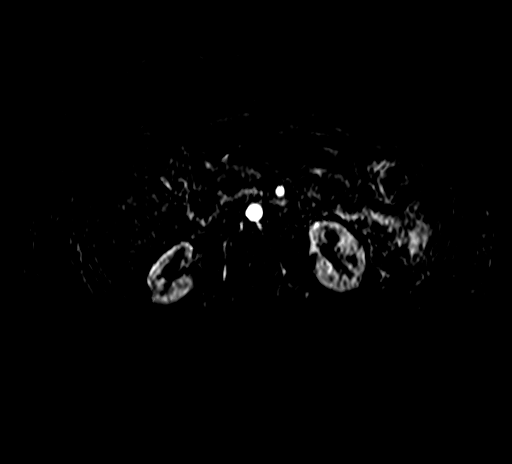
[im 72/72]
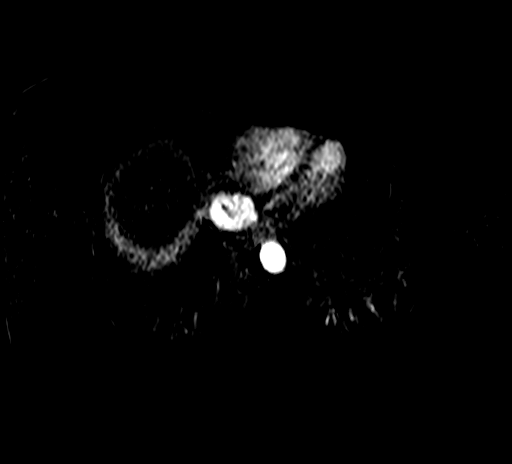

[25 of 48 positions shown; findings below may reference images not displayed]

FINDINGS: Lower chest:  Unremarkable.

Hepatobiliary: No mass or other parenchymal abnormality identified.
Prior cholecystectomy noted. No evidence of biliary ductal
dilatation or choledocholithiasis.

Pancreas: A unilocular cyst is seen in the head of the pancreas
which measures 1.2 x 1.3 cm on image 21 of series 5. This shows no
evidence of contrast enhancement or other complex features. No other
pancreatic lesions are identified. No evidence of pancreatic ductal
dilatation.

Spleen:  Within normal limits in size and appearance.

Adrenal Glands:  No mass identified.

Kidneys: No masses identified. Tiny right upper pole renal cyst
noted as well as mild renal parenchymal scarring involving both
kidneys. No evidence of hydronephrosis.

Stomach/Bowel/Peritoneum: Visualized portions within the abdomen are
unremarkable.

Vascular/Lymphatic: No pathologically enlarged lymph nodes
identified. No other significant abnormality noted.

Other:  None.

Musculoskeletal:  No suspicious bone lesions identified.
IMPRESSION: 1.3 cm probably benign cystic lesion in the pancreatic head.
Followup by MRI is recommended in 12 months to confirm stability.
This recommendation follows ACR consensus guidelines: Managing
Incidental Findings on Abdominal CT: White Paper of the ACR
Incidental Findings Committee. [HOSPITAL] [id number];[DATE].

## 2017-01-27 ENCOUNTER — Other Ambulatory Visit: Payer: Self-pay | Admitting: Internal Medicine

## 2017-01-27 DIAGNOSIS — Z1231 Encounter for screening mammogram for malignant neoplasm of breast: Secondary | ICD-10-CM

## 2017-03-05 ENCOUNTER — Ambulatory Visit
Admission: RE | Admit: 2017-03-05 | Discharge: 2017-03-05 | Disposition: A | Discharge status: Home / Self Care | Payer: Medicare Other | Source: Ambulatory Visit | Attending: Internal Medicine | Admitting: Internal Medicine

## 2017-03-05 DIAGNOSIS — Z1231 Encounter for screening mammogram for malignant neoplasm of breast: Secondary | ICD-10-CM

## 2017-09-15 ENCOUNTER — Ambulatory Visit (HOSPITAL_COMMUNITY): Admission: RE | Admit: 2017-09-15 | Payer: Medicare Other | Source: Ambulatory Visit | Admitting: Cardiology

## 2017-09-15 ENCOUNTER — Encounter (HOSPITAL_COMMUNITY): Admission: RE | Payer: Self-pay | Source: Ambulatory Visit

## 2017-09-15 SURGERY — LOWER EXTREMITY ANGIOGRAPHY
Anesthesia: LOCAL

## 2017-11-09 NOTE — Progress Notes (Signed)
11/04/2017: Creatinine 1.2, EGFR 41/48, potassium 4.2, BMP otherwise normal.  RBC 3.6, hemoglobin 9.5, hematocrit 29.6, MCH 26.2, CBC otherwise normal.  INR 1.1, prothrombin time 10.7.

## 2017-11-09 NOTE — H&P (Signed)
Kayla Austin 08/21/2017 7:59 AM Location: [facility] Patient #: [id number] DOB: [birth year] Separated / Language: Kayla Austin / Race: Black or African American Female   History of Present Illness Kayla Page MD; 08/21/2017 10:01 AM) Patient words: Last O/V 07/07/2017; 1 month f/u for pad, caludication, htn.  The patient is a 84 year old female who presents for a follow-up for Leg claudication. She presents here for follow-up of peripheral arterial disease, the past 6-8 months, lower extremity duplex had revealed restenosis of the right SFA angioplasty site but she had stated that she was asymptomatic but has reduced her physical activity. She started physical activity again and has noticed marked increase in exercise capacity with severe cramping in her legs and now comes back to the office for four-week interval for follow-up of claudication and now requested. Proceed with angiography due to her symptoms. I had seen her a month ago. She also states that her left leg also hurts but not as bad and especially at night she tends to have severe cramps right worse than the left.  She has severe PAD, hypertension, chronic atrial fibrillation, asymptomatic carotid stenosis, hypertension and GERD. She had occluded her right SFA and underwent retrograde SFA successful revascularization via use of her collaterals by Dr. Brunetta Austin at [place] on [facility] angio 12/17/2015. Her left SFA high grade tandem and diffuse 80% stenosis, 2 vessel R/O below left knee with dominant peroneal and diffusely diseased PT and occluded AT.   Problem List/Past Medical (Kayla Austin; 08/21/2017 9:15 AM) Arthritis (M19.90)  History of diverticulitis of colon (Z87.19) [01/25/2013]: Follows Dr. Carol Austin. Shortness of breath on exertion (R06.02)  Essential hypertension (I10)  Chronic atrial fibrillation (I48.2)  CHA2DS2-VASc Score is 5 with yearly risk of stroke of 6.7 %. Lexiscan stress 09/10/12:  Prominent breast attenuation artifact noted in the anteroseptal region. There is no evidence of ischemia by polar plot images, however a small sized septal scar cannot be completely excluded. This defect could also be due to presence of left bundle branch block. Overall a low risk study. Echo 09/02/12: Normal LVEF, mod biatrial enlargement. Mild TR. Left ventricular cavity is normal in size. Normal global wall motion. Normal systolic global function. Calculated EF 56%. No pulmonary hypertension. Cardioversionon 10/05/2012 successful, but reverted to A. Fibrillation 2 weeks later. Gastroesophageal reflux disease without esophagitis (K21.9)  History of colonic polyps (Z86.010)  2009 2 polyps removed by Dr. Benson Austin. No GIB history. Hyperlipidemia, group A (E78.00)  PAD (peripheral artery disease) (I73.9)  Lower extremity arterial duplex 05/04/2017 Restitive waveforms in the proxmial SFA and onto the vessels below knee may suggest proximal significant inflow disease or diffuse disease throughout. No hemodynamically significant stenoses are identified in the left lower extremity arterial system. >50% right profunda femoral artery stenosis. This exam reveals moderately decreased perfusion of the right lower extremity, with RABI 0.57 noted at the dorsalis pedis and post tibial artery level. This exam reveals moderately decreased perfusion of the left lower extremity, with LABI 0.71 noted at the dorsalis pedis artery level. Compared to the study done on 10/09/2016, no significant change. Study may suggest diffuse in-stent restenosis throughout the stented right SFA. Hypertension associated with stage 2 chronic kidney disease due to type 2 diabetes mellitus (E11.22)  Long term current use of anticoagulant (Z79.01)  Anemia, unspecified type (D64.9)  Labwork  05/19/2017: Cholesterol 156, triglycerides 71, HDL 63, LDL 79. Creatinine 1.12, potassium 5.0, CMP normal. CBC normal. Labs 11/07/2016: CBC normal. Creatinine  1.23, EGFR  41, potassium 4.4, CMP otherwise normal. Cholesterol 161, triglycerides 73, HDL 62, LDL 84. 05/09/2016: Total cholesterol 133, triglycerides 57, HDL 63, LDL 59, creatinine 1.06, eGFR 57, potassium 4.5, CBC normal 01/10/2016: Creatinine 1.02, eGFR 60, potassium 4.9 12/17/2015: Hb 11.9/Hct 36, normocytic indices, BUN 28, creatinine 1.12, eGFR 57, potassium 4.1 11/12/2015: Creatinine 1.09, eGFR 55, potassium 4.2, CBC normal, PT/INR normal 10/05/2015: total cholesterol 188, triglycerides 63, HDL 72, LDL 103, creatinine 1.09, CMP normal, CBC normal 03/28/2015: CBC normal, iron saturation mildly decreased at 13%, TIBC normal, UIBC normal, serum iron normal, serum ferritin normal, transferrin saturation decreased at 10% 11/23/2014: total cholesterol 134, triglycerides 56, HDL 48, LDL 75, TSH 0.742, HB 10.3/HCT 32.1 with mircrocytic indicies, creatinine 0.94, CMP normal 08/28/2014: HB 9.4/HCT 28.6. RDW 17.2. BMP 08/25/2014: Potassium 2.8, BUN 4, serum creatinine 1.01. EGFR 60 mL. 03/31/2014: Total cholesterol 130, triglycerides 66, HDL 41, LDL 76. LDL particle #[id number]. Lipid profile is within normal limits. CBC was normal, CMP revealed blood sugar of 102 mg, BUN 13, serum creatinine 1.18 with eGFR 96m. Otherwise CMP was within normal limits. No significant change from 03/02/2012. Contraindication to antiplatelet therapy (Z53.09)  Patient on Eliquis. Bleeding risk Pulmonary hypertension (I27.20)  Echocardiogram 05/01/2016: Left ventricle cavity is normal in size. Unable to evaluate diastolic function due to A. Fibrillation. Mild generalized hypokinesis. LVEF lower limit of normal. Visual EF is 50-55%. Calculated EF 55%. Left atrial cavity is mildly dilated at 4.1 cm. Right atrial cavity is mildly dilated. Right ventricle cavity is mildly dilated. Normal right ventricular function. Mild to moderate central mitral regurgitation. Moderate to severe tricuspid regurgitation. Moderate pulmonary hypertension. Pulmonary artery  systolic pressure is estimated at 51 mm Hg. Mild pulmonic regurgitation. IVC is dilated with a respiratory response of <50%. This may suggests elevated right heart pressure. Carotid stenosis, asymptomatic, right (I65.21)  Carotid artery duplex 06/11/2017 Stenosis in the right internal carotid artery (50-69%). Mild stenosis in the right external carotid artery (<50%). Antegrade right vertebral artery flow. Antegrade left vertebral artery flow. Follow up in six months is appropriate if clinically indicated. Leg swelling (M79.89)   Allergies (Kayla Austin; 08/21/2017 9:15 AM) Ampicillin *PENICILLINS*  Hives.  Family History (Kayla Austin; 08/21/2017 9:15 AM) Siblings  8 siblings (3 living). Father  Deceased. No knowledge of death. No known medical history. Mother  Deceased. at age 489from Alzheimer's;hx of hypertension.  Social History (Kayla Austin; 08/21/2017 9:15 AM) Marital status  Separated. Living Situation  Lives alone. Number of Children  5. 4 living Current tobacco use  Never smoker. Alcohol Use  Occasional alcohol use. minimal  Past Surgical History (Kayla Austin; 08/21/2017 9:15 AM) Hysterectomy [1973]: I&D [01/2017]: incision and drainage boil Cholecystectomy [2000]: Colectomy [08/18/2014]: Partial for diverticulitis  Medication History (Kayla Page MD; 08/21/2017 9:52 AM) AmLODIPine Besylate (5MG Tablet, 1 (one) Oral daily, Taken starting 07/07/2017) Active. (reduced dose) Atorvastatin Calcium (10MG Tablet, 1 (one) T Tablet Tablet Oral daily, Taken starting 06/28/2017) Active. Losartan Potassium (25MG Tablet, 1 (one) Oral daily, Taken starting 05/18/2017) Active. Labetalol HCl (100MG Tablet, 1/2 (one half) Oral daily, Taken starting 05/18/2017) Active. Eliquis (5MG Tablet, 1 (one) Tablet Tablet Oral two times daily, Taken starting 01/05/2017) Active. Hydrochlorothiazide (25MG Tablet, 1 Oral Daily) Active. Vitamin D3 (1000UNIT Capsule, 1  Oral daily) Active. Medications Reconciled (Pt brought medications)  Diagnostic Studies History (Kayla Page MD; 08/21/2017 9:43 AM) Lower Extremity Dopplers [10/09/2016]: Right Profunda femoral artery shows >50% stenosis. Right SFA shows dampened wavefroms throughtout the stented segment and suggests  diffuse instent restenosis. No hemodynamically significant stenoses are identified in the left lower extremity arterial system. This exam reveals moderately decreased perfusion of the right lower extremity, with RABI 0.57 and mildly decreased perfusion of the left lower extremity, with LABI 0.80 noted at the post tibial artery level. Compared to the ABI done on 01/08/2016, right 0.49 and left 0.55. Compared to 04/10/2016, right instent restenosis is new Selective Peripheral Angiogram (Right) [12/17/2015]: Right CFA and profunda widely patent. Right proximal SFA occludes and reconstitutes by profunda collaterals at the level of the distal SFA. Right popliteal widely patent. 3 vessel runoff to the foot. Successful PTA and stenting of right SFA 100% to <20% with a 6x100 and a 6x60 Zylver PTX DES    Review of Systems Kayla Page MD; 08/21/2017 10:01 AM) General Not Present- Fever, Tiredness, Weight Gain > 10lbs. and Weight Loss > 10lbs.Marland Kitchen HEENT Not Present- Blurred Vision and Headache. Respiratory Present- Decreased Exercise Tolerance and Difficulty Breathing on Exertion (chronic). Cardiovascular Present- Claudications (symptoms improved significantly since angioplasty.) and Edema (bilateral ankle swelling since increasing amlodipine). Not Present- Chest Pain, Fainting, Orthopnea, Palpitations and Paroxysmal Nocturnal Dyspnea. Gastrointestinal Present- Heartburn (Takes PPI and Zantac). Not Present- Black, Tarry Stool, Bloody Stool and Constipation. Musculoskeletal Present- Muscle Weakness (both legs on walking.). Neurological Not Present- Fainting, Focal Neurological Symptoms,  Unsteadiness and Weakness. Endocrine Not Present- Cold Intolerance, Excessive Thirst and Hot flashes. Hematology Not Present- Abnormal Bleeding, Anemia, Blood Clots and Easy Bruising. All other systems negative  Vitals (Kayla Austin; 08/21/2017 9:31 AM) 08/21/2017 9:19 AM Weight: 187.5 lb Height: 63in Body Surface Area: 1.88 m Body Mass Index: 33.21 kg/m  Pulse: 81 (Regular)  P.OX: 95% (Room air) BP: 114/62 (Sitting, Left Arm, Standard)       Physical Exam Kayla Page, MD; 08/21/2017 10:3 AM) General Mental Status-Alert. General Appearance-Cooperative, Appears younger than stated age, Not in acute distress. Orientation-Oriented X3. Build & Nutrition-Moderately built and Moderately obese.  Head and Neck Thyroid Gland Characteristics - no palpable nodules, no palpable enlargement.  Chest and Lung Exam Palpation Tender - No chest wall tenderness.  Cardiovascular Cardiovascular examination reveals -normal heart sounds, regular rate and rhythm with no murmurs. Inspection Jugular vein - Right - No Distention.  Abdomen Palpation/Percussion Normal exam - Non Tender and No hepatosplenomegaly. Auscultation Normal exam - Bowel sounds normal.  Peripheral Vascular Lower Extremity Inspection - Bilateral - Inspection Normal. Palpation - Edema - Left - Trace edema. Right - Trace edema. Femoral pulse - Bilateral - 2+ and Bruit. Popliteal pulse - Left - Absent. Right - 1+. Dorsalis pedis pulse - Left - Feeble. Right - 0+. Posterior tibial pulse - Bilateral - Absent. Carotid arteries - Right-Carotid bruit. Abdomen-No prominent abdominal aortic pulsation, No epigastric bruit.  Neurologic Motor-Grossly intact without any focal deficits.  Musculoskeletal Global Assessment Left Lower Extremity - normal range of motion without pain. Right Lower Extremity - normal range of motion without pain.    Assessment & Plan Kayla Page MD;  08/21/2017 10:03 AM) PAD (peripheral artery disease) (I73.9) Story: Lower extremity arterial duplex 05/04/2017 Monophasic waveforms in the proxmial SFA and onto the vessels below knee may suggest proximal significant inflow disease or diffuse disease throughout. No hemodynamically significant stenoses are identified in the left lower extremity arterial system. >50% right profunda femoral artery stenosis. This exam reveals moderately decreased perfusion of the right lower extremity, with RABI 0.57 noted at the dorsalis pedis and post tibial artery level. This exam reveals moderately decreased perfusion  of the left lower extremity, with LABI 0.71 noted at the dorsalis pedis artery level. Compared to the study done on 10/09/2016, no significant change. Study may suggest diffuse in-stent restenosis throughout the stented right SFA. Impression: [facility] angio 12/17/2015: Right lower extremity: Right CFA and profunda widely patent. Right proximal SFA occludes and reconstitutes by profunda collaterals at the level of the distal SFA. Right popliteal widely patent. 3 vessel runoff to the foot. Successful PTA and stenting of right SFA 100% to <20% with a 6x100 and a 6x60 Zylver PTX DES  Peripheral arteriogram 11/16/2015: Right SFA flush occluded with 3 vessel R/O below right knee. Left SFA high grade tandem and diffuse 80% stenosis, 2 vessel R/O below left knee Carotid stenosis, asymptomatic, right (I65.21) Story: Carotid artery duplex 06/11/2017 Stenosis in the right internal carotid artery (50-69%). Mild stenosis in the right external carotid artery (<50%). Antegrade right vertebral artery flow. Antegrade left vertebral artery flow. Follow up in six months is appropriate if clinically indicated. Future Plans 12/10/2017: Complete duplex scan of bilateral carotid arteries (35573) - one time Hypertension associated with stage 2 chronic kidney disease due to type 2 diabetes mellitus (E11.22) Current Plans Started  HydroCHLOROthiazide 12.5MG, 1 (one) Capsule every morning, #90, 90 days starting 08/21/2017, Ref. x3. Labwork Story: 05/19/2017: Cholesterol 156, triglycerides 71, HDL 63, LDL 79. Creatinine 1.12, potassium 5.0, CMP normal. CBC normal.  Labs 11/07/2016: CBC normal. Creatinine 1.23, EGFR 41, potassium 4.4, CMP otherwise normal. Cholesterol 161, triglycerides 73, HDL 62, LDL 84.  Note:. Recommendation:  Patient is here on a one-month follow-up visit of peripheral arterial disease, leg edema and hypertension. On her last office visit patient in spite of markedly abnormal Dopplers and claudication did not want to proceed with angiography but had markedly reduced her activity. On his immediate activity she has noticed severe cramping even during minimal activities and comes in requesting that we proceed with angiography.  Leg edema has resolved on decreasing the dose of amlodipine. She still feels occasional dizziness, states that her blood pressure tends to be low, hence I reduce the dose of hydrochlorothiazide from 25 mg to 12.5 mg. Otherwise no changes in her medications were done today. She is now on labetalol 100 mg one half tablet b.i.d., amlodipine 5 mg daily, losartan 25 mg daily. I'll set her up for peripheral angiography, advised her to hold Eliquis one day prior to the procedure and on the day of the procedure. Needs to schedule for peripheral arteriogram and possible angioplasty given symptoms. Patient understands the risks, benefits, alternatives including medical therapy, CT angiography. Patient understands <1-2% risk of death, embolic complications, bleeding, infection, renal failure, urgent surgical revascularization, but not limited to these and wants to proceed.  CC: Dr. Barbette Merino    Signed by Kayla Page, MD (08/21/2017 10:04 AM)

## 2017-11-10 ENCOUNTER — Encounter (HOSPITAL_COMMUNITY)
Admission: RE | Disposition: A | Discharge status: Home / Self Care | Payer: Self-pay | Source: Ambulatory Visit | Attending: Cardiology

## 2017-11-10 ENCOUNTER — Encounter (HOSPITAL_COMMUNITY): Payer: Self-pay | Admitting: Cardiology

## 2017-11-10 ENCOUNTER — Ambulatory Visit (HOSPITAL_COMMUNITY)
Admission: RE | Admit: 2017-11-10 | Discharge: 2017-11-10 | Disposition: A | Discharge status: Home / Self Care | Payer: Medicare Other | Source: Ambulatory Visit | Attending: Cardiology | Admitting: Cardiology

## 2017-11-10 DIAGNOSIS — M1990 Unspecified osteoarthritis, unspecified site: Secondary | ICD-10-CM | POA: Diagnosis not present

## 2017-11-10 DIAGNOSIS — Z7901 Long term (current) use of anticoagulants: Secondary | ICD-10-CM | POA: Insufficient documentation

## 2017-11-10 DIAGNOSIS — E1151 Type 2 diabetes mellitus with diabetic peripheral angiopathy without gangrene: Secondary | ICD-10-CM | POA: Diagnosis not present

## 2017-11-10 DIAGNOSIS — K219 Gastro-esophageal reflux disease without esophagitis: Secondary | ICD-10-CM | POA: Diagnosis not present

## 2017-11-10 DIAGNOSIS — I6521 Occlusion and stenosis of right carotid artery: Secondary | ICD-10-CM | POA: Insufficient documentation

## 2017-11-10 DIAGNOSIS — I272 Pulmonary hypertension, unspecified: Secondary | ICD-10-CM | POA: Insufficient documentation

## 2017-11-10 DIAGNOSIS — I739 Peripheral vascular disease, unspecified: Secondary | ICD-10-CM | POA: Diagnosis present

## 2017-11-10 DIAGNOSIS — I129 Hypertensive chronic kidney disease with stage 1 through stage 4 chronic kidney disease, or unspecified chronic kidney disease: Secondary | ICD-10-CM | POA: Diagnosis not present

## 2017-11-10 DIAGNOSIS — E1122 Type 2 diabetes mellitus with diabetic chronic kidney disease: Secondary | ICD-10-CM | POA: Diagnosis not present

## 2017-11-10 DIAGNOSIS — N182 Chronic kidney disease, stage 2 (mild): Secondary | ICD-10-CM | POA: Insufficient documentation

## 2017-11-10 DIAGNOSIS — I482 Chronic atrial fibrillation: Secondary | ICD-10-CM | POA: Diagnosis not present

## 2017-11-10 DIAGNOSIS — D649 Anemia, unspecified: Secondary | ICD-10-CM | POA: Diagnosis not present

## 2017-11-10 DIAGNOSIS — Z880 Allergy status to penicillin: Secondary | ICD-10-CM | POA: Diagnosis not present

## 2017-11-10 DIAGNOSIS — E785 Hyperlipidemia, unspecified: Secondary | ICD-10-CM | POA: Insufficient documentation

## 2017-11-10 DIAGNOSIS — Z79899 Other long term (current) drug therapy: Secondary | ICD-10-CM | POA: Diagnosis not present

## 2017-11-10 HISTORY — PX: LOWER EXTREMITY ANGIOGRAPHY: CATH118251

## 2017-11-10 HISTORY — PX: PERIPHERAL VASCULAR BALLOON ANGIOPLASTY: CATH118281

## 2017-11-10 HISTORY — PX: PERIPHERAL VASCULAR ATHERECTOMY: CATH118256

## 2017-11-10 LAB — POCT ACTIVATED CLOTTING TIME
ACTIVATED CLOTTING TIME: 213 s
Activated Clotting Time: 235 seconds

## 2017-11-10 SURGERY — LOWER EXTREMITY ANGIOGRAPHY
Anesthesia: LOCAL

## 2017-11-10 MED ORDER — SODIUM CHLORIDE 0.9% FLUSH: 3.0000 mL | Freq: Two times a day (BID) | INTRAVENOUS | Status: DC

## 2017-11-10 MED ORDER — LIDOCAINE HCL (PF) 1 % IJ SOLN
INTRAMUSCULAR | Status: DC | PRN
  Administered 2017-11-10: 15 mL

## 2017-11-10 MED ORDER — HEPARIN (PORCINE) IN NACL 2-0.9 UNIT/ML-% IJ SOLN
INTRAMUSCULAR | Status: AC
  Filled 2017-11-10: qty 1000

## 2017-11-10 MED ORDER — HEPARIN SODIUM (PORCINE) 1000 UNIT/ML IJ SOLN
INTRAMUSCULAR | Status: AC
  Filled 2017-11-10: qty 1

## 2017-11-10 MED ORDER — SODIUM CHLORIDE 0.9% FLUSH: 3.0000 mL | INTRAVENOUS | Status: DC | PRN

## 2017-11-10 MED ORDER — HEPARIN SODIUM (PORCINE) 1000 UNIT/ML IJ SOLN
INTRAMUSCULAR | Status: DC | PRN
  Administered 2017-11-10 (×2): 2000 [IU] via INTRAVENOUS
  Administered 2017-11-10: 8000 [IU] via INTRAVENOUS
  Administered 2017-11-10: 3000 [IU] via INTRAVENOUS

## 2017-11-10 MED ORDER — CLOPIDOGREL BISULFATE 300 MG PO TABS
ORAL_TABLET | ORAL | Status: AC
  Filled 2017-11-10: qty 1

## 2017-11-10 MED ORDER — SODIUM CHLORIDE 0.9 % IV SOLN
INTRAVENOUS | Status: AC
  Administered 2017-11-10: 06:00:00 via INTRAVENOUS

## 2017-11-10 MED ORDER — HEPARIN (PORCINE) IN NACL 2-0.9 UNIT/ML-% IJ SOLN
INTRAMUSCULAR | Status: AC | PRN
  Administered 2017-11-10: 1000 mL

## 2017-11-10 MED ORDER — SODIUM CHLORIDE 0.9 % IV SOLN: 250.0000 mL | INTRAVENOUS | Status: DC | PRN

## 2017-11-10 MED ORDER — FENTANYL CITRATE (PF) 100 MCG/2ML IJ SOLN
INTRAMUSCULAR | Status: DC | PRN
  Administered 2017-11-10: 25 ug via INTRAVENOUS
  Administered 2017-11-10: 50 ug via INTRAVENOUS
  Administered 2017-11-10: 25 ug via INTRAVENOUS

## 2017-11-10 MED ORDER — FENTANYL CITRATE (PF) 100 MCG/2ML IJ SOLN
INTRAMUSCULAR | Status: AC
  Filled 2017-11-10: qty 2

## 2017-11-10 MED ORDER — CLOPIDOGREL BISULFATE 75 MG PO TABS: 75.0000 mg | ORAL_TABLET | Freq: Every day | ORAL | 1 refills | Status: DC

## 2017-11-10 MED ORDER — LIDOCAINE HCL (PF) 1 % IJ SOLN
INTRAMUSCULAR | Status: AC
  Filled 2017-11-10: qty 30

## 2017-11-10 MED ORDER — ELIQUIS 5 MG PO TABS: 5.0000 mg | ORAL_TABLET | Freq: Two times a day (BID) | ORAL | 6 refills | Status: DC

## 2017-11-10 MED ORDER — IODIXANOL 320 MG/ML IV SOLN
INTRAVENOUS | Status: DC | PRN
  Administered 2017-11-10: 135 mL via INTRA_ARTERIAL

## 2017-11-10 MED ORDER — SODIUM CHLORIDE 0.9 % WEIGHT BASED INFUSION: 1.0000 mL/kg/h | INTRAVENOUS | Status: DC

## 2017-11-10 MED ORDER — MIDAZOLAM HCL 2 MG/2ML IJ SOLN
INTRAMUSCULAR | Status: DC | PRN
  Administered 2017-11-10 (×2): 1 mg via INTRAVENOUS

## 2017-11-10 MED ORDER — NITROGLYCERIN 1 MG/10 ML FOR IR/CATH LAB
INTRA_ARTERIAL | Status: AC
  Filled 2017-11-10: qty 10

## 2017-11-10 MED ORDER — CLOPIDOGREL BISULFATE 75 MG PO TABS
300.0000 mg | ORAL_TABLET | Freq: Once | ORAL | Status: AC
  Administered 2017-11-10: 300 mg via ORAL

## 2017-11-10 MED ORDER — MIDAZOLAM HCL 2 MG/2ML IJ SOLN
INTRAMUSCULAR | Status: AC
  Filled 2017-11-10: qty 2

## 2017-11-10 MED ORDER — NITROGLYCERIN 1 MG/10 ML FOR IR/CATH LAB
INTRA_ARTERIAL | Status: DC | PRN
  Administered 2017-11-10: 300 ug via INTRA_ARTERIAL

## 2017-11-10 SURGICAL SUPPLY — 25 items
BALLN ADMIRAL INPACT 6X200 (BALLOONS) ×3
BALLN CHOCOLATE 5.0X40X120 (BALLOONS) ×3
BALLOON ADMIRAL INPACT 6X200 (BALLOONS) ×2 IMPLANT
BALLOON CHOCOLATE 5.0X40X120 (BALLOONS) ×2 IMPLANT
CATH HAWKONE LX EXTENDED TIP (CATHETERS) ×3 IMPLANT
CATH OMNI FLUSH 5F 65CM (CATHETERS) ×3 IMPLANT
COVER PRB 48X5XTLSCP FOLD TPE (BAG) ×2 IMPLANT
COVER PROBE 5X48 (BAG) ×1
DEVICE CLOSURE PERCLS PRGLD 6F (VASCULAR PRODUCTS) ×2 IMPLANT
DEVICE SPIDERFX EMB PROT 6MM (WIRE) ×6 IMPLANT
KIT ENCORE 26 ADVANTAGE (KITS) ×3 IMPLANT
KIT MICROINTRODUCER STIFF 5F (SHEATH) ×3 IMPLANT
KIT PV (KITS) ×3 IMPLANT
PERCLOSE PROGLIDE 6F (VASCULAR PRODUCTS) ×3
SHEATH FLEXOR ANSEL 1 7F 45CM (SHEATH) ×3 IMPLANT
SHEATH PINNACLE 5F 10CM (SHEATH) ×3 IMPLANT
STOPCOCK MORSE 400PSI 3WAY (MISCELLANEOUS) ×3 IMPLANT
SYRINGE MEDRAD AVANTA MACH 7 (SYRINGE) ×3 IMPLANT
TAPE VIPERTRACK RADIOPAQ (MISCELLANEOUS) ×2 IMPLANT
TAPE VIPERTRACK RADIOPAQUE (MISCELLANEOUS) ×1
TRANSDUCER W/STOPCOCK (MISCELLANEOUS) ×3 IMPLANT
TRAY PV CATH (CUSTOM PROCEDURE TRAY) ×3 IMPLANT
TUBING CIL FLEX 10 FLL-RA (TUBING) ×3 IMPLANT
WIRE HITORQ VERSACORE ST 145CM (WIRE) ×3 IMPLANT
WIRE SPARTACORE .014X300CM (WIRE) ×3 IMPLANT

## 2017-11-10 NOTE — Progress Notes (Signed)
Assumed care of pt from Alfonso RamusJodette Briley, RN. Report received. Assessment documented. No change noted.

## 2017-11-10 NOTE — Discharge Instructions (Signed)

## 2017-11-10 NOTE — Interval H&P Note (Signed)
History and Physical Interval Note:  11/10/2017 7:41 AM  Kayla Austin  has presented today for surgery, with the diagnosis of pvd  The various methods of treatment have been discussed with the patient and family. After consideration of risks, benefits and other options for treatment, the patient has consented to  Procedure(s): LOWER EXTREMITY ANGIOGRAPHY (N/A) as a surgical intervention .  The patient's history has been reviewed, patient examined, no change in status, stable for surgery.  I have reviewed the patient's chart and labs.  Questions were answered to the patient's satisfaction.     Anamae Rochelle J Delon Revelo

## 2018-02-02 ENCOUNTER — Other Ambulatory Visit: Payer: Self-pay | Admitting: Internal Medicine

## 2018-02-02 DIAGNOSIS — Z1231 Encounter for screening mammogram for malignant neoplasm of breast: Secondary | ICD-10-CM

## 2018-03-08 ENCOUNTER — Ambulatory Visit
Admission: RE | Admit: 2018-03-08 | Discharge: 2018-03-08 | Disposition: A | Discharge status: Home / Self Care | Payer: Medicare Other | Source: Ambulatory Visit | Attending: Internal Medicine | Admitting: Internal Medicine

## 2018-03-08 DIAGNOSIS — Z1231 Encounter for screening mammogram for malignant neoplasm of breast: Secondary | ICD-10-CM

## 2018-07-02 ENCOUNTER — Other Ambulatory Visit (HOSPITAL_COMMUNITY): Payer: Self-pay | Admitting: Endocrinology

## 2018-07-02 DIAGNOSIS — E0590 Thyrotoxicosis, unspecified without thyrotoxic crisis or storm: Secondary | ICD-10-CM

## 2018-07-19 ENCOUNTER — Encounter (HOSPITAL_COMMUNITY)
Admission: RE | Admit: 2018-07-19 | Discharge: 2018-07-19 | Disposition: A | Discharge status: Home / Self Care | Payer: Medicare Other | Source: Ambulatory Visit | Attending: Endocrinology | Admitting: Endocrinology

## 2018-07-19 DIAGNOSIS — E0590 Thyrotoxicosis, unspecified without thyrotoxic crisis or storm: Secondary | ICD-10-CM | POA: Diagnosis present

## 2018-07-19 MED ORDER — SODIUM IODIDE I-123 7.4 MBQ CAPS
467.0000 | ORAL_CAPSULE | Freq: Once | ORAL | Status: AC
  Administered 2018-07-19: 467 via ORAL

## 2018-07-20 ENCOUNTER — Encounter (HOSPITAL_COMMUNITY)
Admission: RE | Admit: 2018-07-20 | Discharge: 2018-07-20 | Disposition: A | Discharge status: Home / Self Care | Payer: Medicare Other | Source: Ambulatory Visit | Attending: Endocrinology | Admitting: Endocrinology

## 2018-07-27 ENCOUNTER — Other Ambulatory Visit: Payer: Self-pay | Admitting: Endocrinology

## 2018-07-27 DIAGNOSIS — E041 Nontoxic single thyroid nodule: Secondary | ICD-10-CM

## 2018-08-03 ENCOUNTER — Ambulatory Visit
Admission: RE | Admit: 2018-08-03 | Discharge: 2018-08-03 | Disposition: A | Discharge status: Home / Self Care | Payer: Medicare Other | Source: Ambulatory Visit | Attending: Endocrinology | Admitting: Endocrinology

## 2018-08-03 DIAGNOSIS — E041 Nontoxic single thyroid nodule: Secondary | ICD-10-CM

## 2018-08-04 ENCOUNTER — Other Ambulatory Visit: Payer: Self-pay | Admitting: Endocrinology

## 2018-08-04 DIAGNOSIS — E041 Nontoxic single thyroid nodule: Secondary | ICD-10-CM

## 2018-08-12 ENCOUNTER — Encounter (HOSPITAL_COMMUNITY): Payer: Self-pay | Admitting: Emergency Medicine

## 2018-08-12 ENCOUNTER — Ambulatory Visit (HOSPITAL_COMMUNITY)
Admission: EM | Admit: 2018-08-12 | Discharge: 2018-08-12 | Disposition: A | Discharge status: Home / Self Care | Payer: Medicare Other | Attending: Family Medicine | Admitting: Family Medicine

## 2018-08-12 DIAGNOSIS — S40862A Insect bite (nonvenomous) of left upper arm, initial encounter: Secondary | ICD-10-CM | POA: Diagnosis not present

## 2018-08-12 DIAGNOSIS — W57XXXA Bitten or stung by nonvenomous insect and other nonvenomous arthropods, initial encounter: Secondary | ICD-10-CM

## 2018-08-12 MED ORDER — HYDROCORTISONE 2.5 % EX LOTN: TOPICAL_LOTION | Freq: Two times a day (BID) | CUTANEOUS | 0 refills | Status: DC

## 2018-08-12 NOTE — ED Triage Notes (Signed)
Pt c/o rash and bumps on her L shoulder and L arm since Monday.

## 2018-08-12 NOTE — Discharge Instructions (Addendum)
Hydrocortisone cream prescribed.  Use as directed.   Wash area daily with mild soap and warm water. Keep covered.   Follow up with PCP if symptoms persists Return or go to the ER if you have any new or worsening symptoms such as pain, fever, chills, abdominal pain, nausea, vomiting, oral manifestation, etc..Marland Kitchen

## 2018-08-12 NOTE — ED Provider Notes (Signed)
[facility]   [id number] 08/12/18 Arrival Time: 1212  CC: SKIN COMPLAINT  SUBJECTIVE:  Kayla Austin is a 84 y.o. female who presents with a rash that began yesterday.  States symptoms began after a insect bit her.  Also, reports starting a new thyroid medication recently.  Localizes the rash to the left upper arm.  Describes it as itchy.  Denies aggravating or alleviating factors.  Denies similar symptoms in the past.  Reports associated mild erythema.  Denies fever, chills, nausea, vomiting, swollen glands, SOB, chest pain, abdominal pain, changes in bowel or bladder function.    ROS: As per HPI.  Past Medical History:  Diagnosis Date  . Arthritis   . Bronchitis    2009  . Chronic kidney disease    stage 3  . Coronary artery disease   . Dysrhythmia    a fib  . GERD (gastroesophageal reflux disease)   . Heart murmur    hx of   . History of colon polyps   . Hyperlipemia   . Hypertension   . Shortness of breath    on exertion   Past Surgical History:  Procedure Laterality Date  . ABDOMINAL HYSTERECTOMY    . APPENDECTOMY    . CARDIOVERSION  10/05/2012   Procedure: CARDIOVERSION;  Surgeon: Pamella Pert, MD;  Location: [facility] ENDOSCOPY;  Service: Cardiovascular;  Laterality: N/A;  . CARPAL TUNNEL RELEASE     right hand  . CHOLECYSTECTOMY    . FLEXIBLE SIGMOIDOSCOPY N/A 06/09/2014   Procedure: FLEXIBLE SIGMOIDOSCOPY;  Surgeon: Theda Belfast, MD;  Location: [facility] ENDOSCOPY;  Service: Endoscopy;  Laterality: N/A;  . LOWER EXTREMITY ANGIOGRAPHY N/A 11/10/2017   Procedure: LOWER EXTREMITY ANGIOGRAPHY;  Surgeon: [name], MD;  Location: [facility] INVASIVE CV LAB;  Service: Cardiovascular;  Laterality: N/A;  . PERIPHERAL VASCULAR ATHERECTOMY  11/10/2017   Procedure: PERIPHERAL VASCULAR ATHERECTOMY;  Surgeon: [name], MD;  Location: [facility] INVASIVE CV LAB;  Service: Cardiovascular;;  . PERIPHERAL VASCULAR BALLOON ANGIOPLASTY  11/10/2017   Procedure: PERIPHERAL VASCULAR BALLOON  ANGIOPLASTY;  Surgeon: [name], MD;  Location: [facility] INVASIVE CV LAB;  Service: Cardiovascular;;  . PERIPHERAL VASCULAR CATHETERIZATION Bilateral 11/16/2015   Procedure: Lower Extremity Angiography;  Surgeon: [name], MD;  Location: [facility] INVASIVE CV LAB;  Service: Cardiovascular;  Laterality: Bilateral;  . PERIPHERAL VASCULAR CATHETERIZATION N/A 11/16/2015   Procedure: Abdominal Aortogram;  Surgeon: [name], MD;  Location: [facility] INVASIVE CV LAB;  Service: Cardiovascular;  Laterality: N/A;   Allergies  Allergen Reactions  . Ampicillin Rash    Has patient had a PCN reaction causing immediate rash, facial/tongue/throat swelling, SOB or lightheadedness with hypotension: Yes Has patient had a PCN reaction causing severe rash involving mucus membranes or skin necrosis: No Has patient had a PCN reaction that required hospitalization: No Has patient had a PCN reaction occurring within the last 10 years:No If all of the above answers are "NO", then may proceed with Cephalosporin use.    No current facility-administered medications on file prior to encounter.    Current Outpatient Medications on File Prior to Encounter  Medication Sig Dispense Refill  . METHIMAZOLE PO Take by mouth.    Marland Kitchen amLODipine (NORVASC) 5 MG tablet Take 5 mg by mouth daily.  3  . atorvastatin (LIPITOR) 10 MG tablet Take 10 mg daily by mouth.     . cholecalciferol (VITAMIN D) 1000 units tablet Take 1,000 Units daily by mouth.     Everlene Balls 5 MG TABS  tablet Take 1 tablet (5 mg total) by mouth 2 (two) times daily. 60 tablet 6  . hydrochlorothiazide (MICROZIDE) 12.5 MG capsule Take 12.5 mg by mouth daily.    Marland Kitchen labetalol (NORMODYNE) 100 MG tablet Take 50 mg 2 (two) times daily by mouth.    . losartan (COZAAR) 25 MG tablet Take 25 mg daily by mouth.  3   Social History   Socioeconomic History  . Marital status: Legally Separated    Spouse name: Not on file  . Number of children: Not on file  . Years of education: Not on file  .  Highest education level: Not on file  Occupational History  . Not on file  Social Needs  . Financial resource strain: Not on file  . Food insecurity:    Worry: Not on file    Inability: Not on file  . Transportation needs:    Medical: Not on file    Non-medical: Not on file  Tobacco Use  . Smoking status: Never Smoker  . Smokeless tobacco: Never Used  Substance and Sexual Activity  . Alcohol use: Yes    Comment: occasional  . Drug use: No  . Sexual activity: Not Currently  Lifestyle  . Physical activity:    Days per week: Not on file    Minutes per session: Not on file  . Stress: Not on file  Relationships  . Social connections:    Talks on phone: Not on file    Gets together: Not on file    Attends religious service: Not on file    Active member of club or organization: Not on file    Attends meetings of clubs or organizations: Not on file    Relationship status: Not on file  . Intimate partner violence:    Fear of current or ex partner: Not on file    Emotionally abused: Not on file    Physically abused: Not on file    Forced sexual activity: Not on file  Other Topics Concern  . Not on file  Social History Narrative  . Not on file   Family History  Problem Relation Age of Onset  . Cancer Sister     OBJECTIVE: Vitals:   08/12/18 1249  BP: (!) 147/75  Pulse: 81  Resp: 18  Temp: (!) 96.9 F (36.1 C)  SpO2: 98%    General appearance: alert; no distress Lungs: clear to auscultation bilaterally Heart: regular rate and rhythm.  Radial pulse 2+ bilaterally Extremities: no edema Skin: warm and dry; mildly erythematous macule with punctate lesion localized to medial aspect of left upper arm, approximately 1-2 cm in diameter; blanches with pressure; nontender to palpation; no obvious drainage Psychological: alert and cooperative; normal mood and affect  ASSESSMENT & PLAN:  1. Insect bite of left upper extremity, initial encounter    DDX: shingles, localized  drug reaction  Meds ordered this encounter  Medications  . hydrocortisone 2.5 % lotion    Sig: Apply topically 2 (two) times daily.    Dispense:  59 mL    Refill:  0    Order Specific Question:   Supervising Provider    Answer:   Isa Rankin [[id number]]   Hydrocortisone cream prescribed.  Use as directed.   Wash area daily with mild soap and warm water. Keep covered.   Follow up with PCP if symptoms persists Return or go to the ER if you have any new or worsening symptoms such as pain, fever, chills,  abdominal pain, nausea, vomiting, oral manifestation, etc...  Reviewed expectations re: course of current medical issues. Questions answered. Outlined signs and symptoms indicating need for more acute intervention. Patient verbalized understanding. After Visit Summary given.   Rennis Harding, PA-C 08/12/18 1400

## 2018-11-29 ENCOUNTER — Ambulatory Visit (INDEPENDENT_AMBULATORY_CARE_PROVIDER_SITE_OTHER): Payer: Medicare Other | Admitting: Cardiology

## 2018-11-29 ENCOUNTER — Encounter: Payer: Self-pay | Admitting: Cardiology

## 2018-11-29 ENCOUNTER — Ambulatory Visit: Payer: Self-pay

## 2018-11-29 VITALS — BP 140/73 | HR 84 | Ht 62.0 in | Wt 138.2 lb

## 2018-11-29 DIAGNOSIS — R160 Hepatomegaly, not elsewhere classified: Secondary | ICD-10-CM | POA: Diagnosis not present

## 2018-11-29 DIAGNOSIS — I739 Peripheral vascular disease, unspecified: Secondary | ICD-10-CM | POA: Diagnosis not present

## 2018-11-29 DIAGNOSIS — Z09 Encounter for follow-up examination after completed treatment for conditions other than malignant neoplasm: Secondary | ICD-10-CM

## 2018-11-29 DIAGNOSIS — N183 Chronic kidney disease, stage 3 unspecified: Secondary | ICD-10-CM | POA: Insufficient documentation

## 2018-11-29 DIAGNOSIS — I6521 Occlusion and stenosis of right carotid artery: Secondary | ICD-10-CM | POA: Diagnosis not present

## 2018-11-29 DIAGNOSIS — I482 Chronic atrial fibrillation, unspecified: Secondary | ICD-10-CM

## 2018-11-29 NOTE — Progress Notes (Signed)
Patient is here for follow up visit.  Subjective:   @Patient  ID: Esmeralda ArthurLessie A Duross, female    DOB: 03/05/1932, 85 y.o.   MRN: [id number]  Chief Complaint  Patient presents with  . Follow-up    6 month f/u, pt c/o doe    HPI  Meryn "Arlean" Durene CalHunter is an 85 year old African American female with a medical history significant for severe PAD, S/P right SFA angioplasty in 2017 and again in Jan 2018 3 vessel R/O below knee. She has left SFA high grade stenosis with 2 vessel R/O left below knee. She has history of hyperlipidemia, hyperthyroidism with multinodular goitre on Methimazole, asymptomatic carotid stenosis, LBBB and chronic atrial fibrillation.   She now presents for a 6 month follow-up for PAD. She denies bleeding symptoms. She continues to have peripheral edema which has remained stable since last office visit. She admits to having very minimum claudication symptoms, but experiences some cramping at night. She has   Past Medical History:  Diagnosis Date  . Arthritis   . Bronchitis    2009  . Chronic kidney disease    stage 3  . Coronary artery disease   . Dysrhythmia    a fib  . GERD (gastroesophageal reflux disease)   . Heart murmur    hx of   . History of colon polyps   . Hyperlipemia   . Hypertension   . Shortness of breath    on exertion    Past Surgical History:  Procedure Laterality Date  . ABDOMINAL HYSTERECTOMY    . APPENDECTOMY    . CARDIOVERSION  10/05/2012   Procedure: CARDIOVERSION;  Surgeon: Pamella PertJagadeesh R Tylisha Danis, MD;  [facility];  Service: Cardiovascular;  Laterality: N/A;  . CARPAL TUNNEL RELEASE     right hand  . CHOLECYSTECTOMY    . FLEXIBLE SIGMOIDOSCOPY N/A 06/09/2014   Procedure: FLEXIBLE SIGMOIDOSCOPY;  Surgeon: Theda BelfastPatrick D Hung, MD;  [facility];  Service: Endoscopy;  Laterality: N/A;  . LOWER EXTREMITY ANGIOGRAPHY N/A 11/10/2017   Procedure: LOWER EXTREMITY ANGIOGRAPHY;  Surgeon: Yates DecampGanji, Miriam Kestler, MD;  Location: [facility] INVASIVE CV  LAB;  Service: Cardiovascular;  Laterality: N/A;  . PERIPHERAL VASCULAR ATHERECTOMY  11/10/2017   Procedure: PERIPHERAL VASCULAR ATHERECTOMY;  Surgeon: Yates DecampGanji, Domonick Sittner, MD;  [facility];  Service: Cardiovascular;;  . PERIPHERAL VASCULAR BALLOON ANGIOPLASTY  11/10/2017   Procedure: PERIPHERAL VASCULAR BALLOON ANGIOPLASTY;  Surgeon: Yates DecampGanji, Merri Dimaano, MD;  Location: [facility] INVASIVE CV LAB;  Service: Cardiovascular;;  . PERIPHERAL VASCULAR CATHETERIZATION Bilateral 11/16/2015   Procedure: Lower Extremity Angiography;  Surgeon: Yates DecampJay Adian Jablonowski, MD;  [facility];  Service: Cardiovascular;  Laterality: Bilateral;  . PERIPHERAL VASCULAR CATHETERIZATION N/A 11/16/2015   Procedure: Abdominal Aortogram;  Surgeon: Yates DecampJay Callista Hoh, MD;  [facility];  Service: Cardiovascular;  Laterality: N/A;    Social History   Socioeconomic History  . Marital status: Legally Separated    Spouse name: Not on file  . Number of children: Not on file  . Years of education: Not on file  . Highest education level: Not on file  Occupational History  . Not on file  Social Needs  . Financial resource strain: Not on file  . Food insecurity:    Worry: Not on file    Inability: Not on file  . Transportation needs:    Medical: Not on file    Non-medical: Not on file  Tobacco Use  . Smoking status: Never Smoker  .  Smokeless tobacco: Never Used  Substance and Sexual Activity  . Alcohol use: Yes    Comment: occasional  . Drug use: No  . Sexual activity: Not Currently  Lifestyle  . Physical activity:    Days per week: Not on file    Minutes per session: Not on file  . Stress: Not on file  Relationships  . Social connections:    Talks on phone: Not on file    Gets together: Not on file    Attends religious service: Not on file    Active member of club or organization: Not on file    Attends meetings of clubs or organizations: Not on file    Relationship status: Not on file  . Intimate partner  violence:    Fear of current or ex partner: Not on file    Emotionally abused: Not on file    Physically abused: Not on file    Forced sexual activity: Not on file  Other Topics Concern  . Not on file  Social History Narrative  . Not on file    Current Outpatient Medications on File Prior to Visit  Medication Sig Dispense Refill  . atorvastatin (LIPITOR) 10 MG tablet Take 10 mg daily by mouth.     . cholecalciferol (VITAMIN D) 1000 units tablet Take 1,000 Units daily by mouth.     Everlene Balls 5 MG TABS tablet Take 1 tablet (5 mg total) by mouth 2 (two) times daily. 60 tablet 6  . famotidine (PEPCID) 40 MG/5ML suspension Take by mouth daily.    . furosemide (LASIX) 20 MG tablet Take 20 mg by mouth daily.    . hydrochlorothiazide (MICROZIDE) 12.5 MG capsule Take 25 mg by mouth daily.     Marland Kitchen labetalol (NORMODYNE) 100 MG tablet Take 50 mg 2 (two) times daily by mouth.    . losartan (COZAAR) 25 MG tablet Take 25 mg daily by mouth.  3  . METHIMAZOLE PO Take 5 mg by mouth daily.     . potassium chloride (K-DUR) 10 MEQ tablet Take 10 mEq by mouth daily.     No current facility-administered medications on file prior to visit.    Doppler Ultrasound  Carotid artery duplex 12/22/2017: Severe stenosis in the right internal carotid artery (>=70%), peak velocity 324/90 cm/Sec. The right PSV internal/common carotid artery ratio is 4.3 and consistent with a stenosis of >70%. Antegrade right vertebral artery flow. Antegrade left vertebral artery flow. Compared to the study done on 06/11/2017, right ICA stenosis increased from <69%. Follow up in six months is appropriate if clinically indicated.  Echocardiogram  03/05/2018: Left ventricle cavity is normal in size. Normal global wall motion. Visual EF is 55-60%. Unable to evaluate diastolic function due to atrial fibrillation. Calculated EF 56%. Left atrial cavity is moderately dilated. Right atrial cavity is mildly dilated. Mild (Grade I) mitral  regurgitation. Moderate tricuspid regurgitation. Estimated pulmonary artery systolic pressure 44 mmHg. No significant change compared to previous study dated 05/01/2016.   Peripheral arteriogram 11/10/2017:  Right prox SFA instent restenosis ( placed 12/17/15 - 6x100 and 6x60 Zilver PTX) S/P Chocolate balloon PTA. Right mid SFA 99% to 0% with LX HawkOne directional atherectomy followed by 6x200 mm ImPact Admiral DCB PTA. Right popliteal widely patent. 3 vessel runoff to the foot. Peripheral Arteriogram [11/16/2015]: Right SFA flush occluded with 3 vessel R/O below right knee. Left SFA high grade tandem and diffuse 80% stenosis, 2 vessel R/O below left knee with dominant peroneal and diffusely  diseased PT. Occluded AT. Renal Ultrasound [12/10/2015]: Right angiomyolipoma. Kidneys are otherwise within normal limits for the patient age. Lower Extremity Doppler  05/20/2018: No hemodynamically significant stenoses are identified in the right lower extremity arterial system. Right proximal SFA <50% stenosis. Significant velocity increase at the left distal superficial femoral artery >50%. This exam reveals mildly decreased perfusion of the right lower extremity, noted at the post tibial artery level (ABI 0.90) and moderately decreased perfusion of the left lower extremity, noted at the dorsalis pedis and post tibial artery level (ABI 0.66). Compared to ABI 03/05/2018, improved overnight from 0.7, no change in left. Right SFA stenosis no longer present suggests successful revascularization.  Review of Systems  Constitutional: Positive for fatigue (thyroid issues being followed by doctor). Unexpected weight change: weight change has remained stable.  HENT: Negative for congestion.   Eyes: Negative for visual disturbance.  Respiratory: Negative for shortness of breath.   Cardiovascular: Positive for leg swelling. Negative for chest pain and palpitations.  Gastrointestinal: Positive for abdominal  distention. Negative for abdominal pain, nausea and vomiting.  Endocrine: Negative for cold intolerance.  Genitourinary: Negative for dysuria.  Musculoskeletal: Negative for myalgias.  Skin: Negative for rash.  Allergic/Immunologic: Negative for immunocompromised state.  Neurological: Negative for dizziness.  Hematological: Does not bruise/bleed easily.  Psychiatric/Behavioral: The patient is not nervous/anxious.   All other systems reviewed and are negative.      Objective: Blood pressure 140/73, pulse 84, height 5\' 2"  (1.575 m), weight 138 lb 3.2 oz (62.7 kg), SpO2 100 %.     Physical Exam Vitals signs and nursing note reviewed.  Constitutional:      General: She is not in acute distress.    Appearance: Normal appearance. She is not toxic-appearing.  HENT:     Head: Normocephalic and atraumatic.     Nose: No congestion.     Mouth/Throat:     Mouth: Mucous membranes are dry.  Eyes:     Pupils: Pupils are equal, round, and reactive to light.  Neck:     Musculoskeletal: Neck supple. No muscular tenderness.     Thyroid: Thyromegaly (known goiter: more significant on the left lobe) present.  Cardiovascular:     Pulses: Decreased pulses.          Femoral pulses are 1+ on the right side with bruit and 1+ on the left side with bruit.      Popliteal pulses are 0 on the right side and 0 on the left side.       Dorsalis pedis pulses are 0 on the right side and 0 on the left side.       Posterior tibial pulses are 0 on the right side and 0 on the left side.     Heart sounds: Murmur (mild aortic stenosis) present. Crescendo  systolic murmur present with a grade of 2/6.  Abdominal:     General: Abdomen is protuberant. A surgical scar is present. Bowel sounds are normal. Distention: mildly distended in upper quadrants.     Palpations: Abdomen is soft. There is hepatomegaly. There is no fluid wave.  Musculoskeletal:        General: No tenderness.     Right lower leg: 2+ Pitting Edema  present.     Left lower leg: 2+ Pitting Edema present.  Lymphadenopathy:     Cervical: No cervical adenopathy.  Skin:    General: Skin is warm and dry.  Neurological:     General: No focal deficit present.  Mental Status: She is alert.  Psychiatric:        Mood and Affect: Mood normal.       Assessment & Recommendations:   EKG 11/29/2018: Atrial fibrillation with controlled ventricular response at the rate of 84 bpm, normal axis, left bundle branch block.  No further analysis.  1.  Chronic atrial fibrillation with controlled ventricular response, presently on anticoagulation. CHA2DS2-VASc Score is 5.  -(CHF; HTN; vasc disease DM,  Female = 1; Age <65 =0; 65-74 = 1,  >75 =2; stroke = 2).  -(Yearly risk of stroke: Score of 1=1.3; 2=2.2; 3=3.2; 4=4; 5=6.7; 6=9.8; 7=>9.8)  2.  Hypertension, 3.  Hyperthyroidism, presently on methimazole. Has multinodular goitre 4.  Peripheral arterial disease without symptoms of claudication, abnormal physical exam. 5.  Asymptomatic high-grade right ICA stenosis.  Recommendation: From cardiac standpoint she remained stable without recurrence of claudication symptoms.  No clinical evidence of limb threatening ischemia.  Atrial fibrillation remained rate controlled.  She has not had any bleeding diathesis on Eliquis.  She has a very large hepatomegaly that is palpable, she will need ultrasound of the abdomen to exclude liver mass.  I will let her PCP normal as well.  She needs carotid artery duplex to follow-up of carotid stenosis.Otherwise from heart standpoint she remained stable, and see back in 6 months.  She does have chronic and efficiency anemia, presently tolerating anticoagulation.  I do not have a recent labs.  She is new murmur, appears to be mild aortic stenosis.  I'll repeat her echocardiogram.  Unless markedly abnormal I'll see her back in 6 months.   [name], MD, [facility] 11/29/2018, 11:01 AM [facility] [place] Pager:  [phone] Office: [phone] If no answer Cell [phone]

## 2018-12-07 ENCOUNTER — Ambulatory Visit
Admission: RE | Admit: 2018-12-07 | Discharge: 2018-12-07 | Disposition: A | Discharge status: Home / Self Care | Payer: Medicare Other | Source: Ambulatory Visit | Attending: Cardiology | Admitting: Cardiology

## 2018-12-07 DIAGNOSIS — R160 Hepatomegaly, not elsewhere classified: Secondary | ICD-10-CM

## 2018-12-09 ENCOUNTER — Ambulatory Visit: Payer: Self-pay | Admitting: Cardiology

## 2018-12-10 ENCOUNTER — Telehealth: Payer: Self-pay

## 2018-12-10 NOTE — Telephone Encounter (Signed)
-----   Message from [name], MD sent at 12/10/2018 12:03 AM EST ----- Abnormal scan suggestive of pancreatic mass and may need MRI. Please f/u PCP.  THere is some question about liver and MRI can further evaluate this. I have forwarded the results to him

## 2018-12-10 NOTE — Progress Notes (Signed)
Abnormal scan suggestive of pancreatic mass and may need MRI. Please f/u PCP.  THere is some question about liver and MRI can further evaluate this. I have forwarded the results to him

## 2018-12-10 NOTE — Telephone Encounter (Signed)
Did not reach pt she did not answer and VM is full, will try again

## 2018-12-10 NOTE — Telephone Encounter (Signed)
-----   Message from Jay Ganji, MD sent at 12/10/2018 12:03 AM EST ----- Abnormal scan suggestive of pancreatic mass and may need MRI. Please f/u PCP.  THere is some question about liver and MRI can further evaluate this. I have forwarded the results to him 

## 2018-12-10 NOTE — Progress Notes (Signed)
Abnormal scan suggestive of pancreatic mass and may need MRI. Please f/u PCP.  THere is some question about liver and MRI can further evaluate this.

## 2018-12-10 NOTE — Telephone Encounter (Signed)
TRIED TO CALL PT TO LET HER KNOW ABOUT RESULTS OF CT SCAN BUT SHE DIDN'T ANSWER AND MAILBOX IS FULL

## 2018-12-29 ENCOUNTER — Other Ambulatory Visit: Payer: Self-pay | Admitting: Cardiology

## 2018-12-31 ENCOUNTER — Telehealth: Payer: Self-pay

## 2018-12-31 ENCOUNTER — Ambulatory Visit: Payer: Medicare Other

## 2018-12-31 ENCOUNTER — Other Ambulatory Visit: Payer: Medicare Other

## 2018-12-31 DIAGNOSIS — I482 Chronic atrial fibrillation, unspecified: Secondary | ICD-10-CM

## 2018-12-31 DIAGNOSIS — I6521 Occlusion and stenosis of right carotid artery: Secondary | ICD-10-CM | POA: Diagnosis not present

## 2019-01-02 ENCOUNTER — Telehealth: Payer: Self-pay | Admitting: Cardiology

## 2019-01-02 NOTE — Telephone Encounter (Signed)
Abnormal Echo. New findings of WMA and low EF. Scheduler OV

## 2019-01-02 NOTE — Telephone Encounter (Signed)
I have placed repeat order on carotid in 6 months.  No change from prior, slight improvement in the stenosis severity from >70% right to < 70%. Left no significant disease.

## 2019-01-07 NOTE — Progress Notes (Signed)
Patient is here for follow up visit.  Subjective:   _0  ID: Kayla Austin, female    DOB: 1932-09-03, 85 y.o.   MRN: [id number]  Chief Complaint  Patient presents with   PAD   Follow-up    HPI  Kayla Austin is an 85 year old African American female with a medical history significant for severe PAD, S/P right SFA angioplasty in 2017 and again in Jan 2018 3 vessel R/O below knee. She has left SFA high grade stenosis with 2 vessel R/O left below knee. She has history of hyperlipidemia, hyperthyroidism with multinodular goitre on Methimazole, asymptomatic carotid stenosis, LBBB and chronic atrial fibrillation.   Patient was seen by me recently about 4-6 weeks ago with new onset murmur, abdominal discomfort and felt to have hepatomegaly.  Ultrasound of the abdomen   She admits to having very minimum claudication symptoms, but experiences some cramping at night.   Past Medical History:  Diagnosis Date   Arthritis    Bilateral leg edema 01/10/2019   Bronchitis    2009   Carotid stenosis, asymptomatic, right 01/10/2019   Chronic kidney disease    stage 3   Chronic renal failure 11/15/2015   Coronary artery disease    Dysrhythmia    a fib   GERD (gastroesophageal reflux disease)    Heart murmur    hx of    History of colon polyps    Hyperlipemia    Hypertension    LBBB (left bundle branch block) 01/10/2019   Long term (current) use of anticoagulants 01/10/2019   Shortness of breath    on exertion    Past Surgical History:  Procedure Laterality Date   ABDOMINAL HYSTERECTOMY     APPENDECTOMY     CARDIOVERSION  10/05/2012   Procedure: CARDIOVERSION;  Surgeon: [name], MD;  Location: [facility];  Service: Cardiovascular;  Laterality: N/A;   CARPAL TUNNEL RELEASE     right hand   CHOLECYSTECTOMY     FLEXIBLE SIGMOIDOSCOPY N/A 06/09/2014   Procedure: FLEXIBLE SIGMOIDOSCOPY;  Surgeon: Beryle Beams, MD;  Location: [facility];  Service: Endoscopy;  Laterality: N/A;   LOWER EXTREMITY ANGIOGRAPHY N/A 11/10/2017   Procedure: LOWER EXTREMITY ANGIOGRAPHY;  Surgeon: Adrian Prows, MD;  Location: [facility];  Service: Cardiovascular;  Laterality: N/A;   PERIPHERAL VASCULAR ATHERECTOMY  11/10/2017   Procedure: PERIPHERAL VASCULAR ATHERECTOMY;  Surgeon: Adrian Prows, MD;  Location: [facility];  Service: Cardiovascular;;   PERIPHERAL VASCULAR BALLOON ANGIOPLASTY  11/10/2017   Procedure: PERIPHERAL VASCULAR BALLOON ANGIOPLASTY;  Surgeon: Adrian Prows, MD;  Location: [facility];  Service: Cardiovascular;;   PERIPHERAL VASCULAR CATHETERIZATION Bilateral 11/16/2015   Procedure: Lower Extremity Angiography;  Surgeon: Adrian Prows, MD;  Location: [facility];  Service: Cardiovascular;  Laterality: Bilateral;   PERIPHERAL VASCULAR CATHETERIZATION N/A 11/16/2015   Procedure: Abdominal Aortogram;  Surgeon: Adrian Prows, MD;  Location: [facility];  Service: Cardiovascular;  Laterality: N/A;    Social History   Socioeconomic History   Marital status: Legally Separated    Spouse name: Not on file   Number of children: 5   Years of education: Not on file   Highest education level: Not on file  Occupational History   Not on file  Social Needs   Financial resource strain: Not on file   Food insecurity:    Worry: Not on file    Inability: Not on file  Transportation needs:    Medical: Not on file    Non-medical: Not on file  Tobacco Use   Smoking status: Never Smoker   Smokeless tobacco: Never Used  Substance and Sexual Activity   Alcohol use: Not Currently   Drug use: No   Sexual activity: Not Currently  Lifestyle   Physical activity:    Days per week: Not on file    Minutes per session: Not on file   Stress: Not on file  Relationships   Social connections:    Talks on phone: Not on file    Gets together: Not on file    Attends religious service: Not on file     Active member of club or organization: Not on file    Attends meetings of clubs or organizations: Not on file    Relationship status: Not on file   Intimate partner violence:    Fear of current or ex partner: Not on file    Emotionally abused: Not on file    Physically abused: Not on file    Forced sexual activity: Not on file  Other Topics Concern   Not on file  Social History Narrative   Not on file    Current Outpatient Medications on File Prior to Visit  Medication Sig Dispense Refill   atorvastatin (LIPITOR) 10 MG tablet Take 10 mg daily by mouth.      cholecalciferol (VITAMIN D) 1000 units tablet Take 1,000 Units daily by mouth.      ELIQUIS 5 MG TABS tablet TAKE 1 TABLET BY MOUTH TWICE DAILY 60 tablet 6   famotidine (PEPCID) 40 MG/5ML suspension Take by mouth as needed.      labetalol (NORMODYNE) 100 MG tablet Take 50 mg 2 (two) times daily by mouth.     losartan (COZAAR) 25 MG tablet Take 25 mg daily by mouth.  3   METHIMAZOLE PO Take 5 mg by mouth daily.      No current facility-administered medications on file prior to visit.     Review of Systems  Constitutional: Positive for fatigue (thyroid issues being followed by doctor). Unexpected weight change: weight change has remained stable.  HENT: Negative for congestion.   Eyes: Negative for visual disturbance.  Respiratory: Negative for shortness of breath.   Cardiovascular: Positive for leg swelling. Negative for chest pain and palpitations.  Gastrointestinal: Positive for abdominal distention. Negative for abdominal pain, nausea and vomiting.  Endocrine: Negative for cold intolerance.  Genitourinary: Negative for dysuria.  Musculoskeletal: Negative for myalgias.  Skin: Negative for rash.  Allergic/Immunologic: Negative for immunocompromised state.  Neurological: Negative for dizziness.  Hematological: Does not bruise/bleed easily.  Psychiatric/Behavioral: The patient is not nervous/anxious.   All other  systems reviewed and are negative.      Objective: Blood pressure (!) 150/82, pulse 86, height _0  (1.626 m), weight 142 lb 6.4 oz (64.6 kg), SpO2 97 %.     Physical Exam Vitals signs and nursing note reviewed.  Constitutional:      General: She is not in acute distress.    Appearance: Normal appearance. She is not toxic-appearing.  HENT:     Head: Normocephalic and atraumatic.     Nose: No congestion.     Mouth/Throat:     Mouth: Mucous membranes are dry.  Eyes:     Pupils: Pupils are equal, round, and reactive to light.  Neck:     Musculoskeletal: Neck supple. No muscular tenderness.     Thyroid: Thyromegaly (known  goiter: more significant on the left lobe) present.  Cardiovascular:     Pulses: Decreased pulses.          Femoral pulses are 1+ on the right side with bruit and 1+ on the left side with bruit.      Popliteal pulses are 0 on the right side and 0 on the left side.       Dorsalis pedis pulses are 0 on the right side and 0 on the left side.       Posterior tibial pulses are 0 on the right side and 0 on the left side.     Heart sounds: Murmur (mild aortic stenosis) present. Crescendo  systolic murmur present with a grade of 2/6.  Abdominal:     General: Abdomen is protuberant. A surgical scar is present. Bowel sounds are normal. Distention: mildly distended in upper quadrants.     Palpations: Abdomen is soft. There is hepatomegaly. There is no fluid wave.  Musculoskeletal:        General: No tenderness.     Right lower leg: 2+ Pitting Edema present.     Left lower leg: 2+ Pitting Edema present.  Lymphadenopathy:     Cervical: No cervical adenopathy.  Skin:    General: Skin is warm and dry.  Neurological:     General: No focal deficit present.     Mental Status: She is alert.  Psychiatric:        Mood and Affect: Mood normal.   Carotid artery duplex  12/31/2018:  Stenosis in the right internal carotid artery (50-69%). Mild plaque left carotid artery. Right  common carotid has diffuse moderate plaque.  Antegrade right vertebral artery flow. Antegrade left vertebral artery flow. Follow up in six months is appropriate if clinically indicated. Compared to the study done on 12/22/2017, right ICA stenosis was greater than 70% now less than 70%.  Echocardiogram 12/31/2018:   Left ventricle cavity is normal in size. Indeterminate diastolic filling pattern due to A. Fib. Left ventricle regional wall motion findings: Basal anteroseptal, Basal inferoseptal, Mid anteroseptal, Mid inferoseptal, Apical anterior and Apical septal akinesis. Visual EF is 20-25%. Left atrial cavity is severely dilated. Right atrial cavity is severely dilated. Right ventricle cavity is moderate to severely dilated. Mildly reduced right ventricular function. Trace aortic regurgitation. Mild aortic valve leaflet thickening. Moderate to severe mitral regurgitation. Mostly Postriorly directed. Severe tricuspid regurgitation. Dilation of the tricuspid valve annulus. Mild pulmonary hypertension. Estimated pulmonary artery systolic pressure is 36 mm Hg with RA pressure estimated at 15 mm Hg. Mild to moderate pulmonic regurgitation. Small pericardial effusion. IVC is dilated with poor inspiration collapse consistent with elevated right atrial pressure. compared to the study done on 03/05/2018, wall motion abnormality is new, previous EF was 55-60%.  Mitral valvular findings are new and previously RV dilatation was not noted although PA pressure was moderately elevated.  US abdomen 12/07/2018: Post cholecystectomy.  Question cirrhotic liver without obvious mass.  Probable small angiomyolipoma upper pole RIGHT kidney 13 mm greatest size slightly increased since 2015.  Two cystic lesions at pancreatic head, 1 increased and 1 potentially new, recommend follow-up characterization of these lesions by MR imaging with and without contrast to exclude cystic pancreatic neoplasm.  Small volume  ascites and RIGHT pleural effusion noted.  Electronically Signed   By: Lavonia Dana M.D.  On: 12/07/2018 15:14  Lower Extremity Doppler07/25/2019: No hemodynamically significant stenoses are identified in the right lower extremity arterial system. Right proximal SFA <50% stenosis.  Significant velocity increase at the left distal superficial femoral artery >50%. This exam reveals mildly decreased perfusion of the right lower extremity, noted at the post tibial artery level (ABI 0.90) and moderately decreased perfusion of the left lower extremity, noted at the dorsalis pedis and post tibial artery level (ABI 0.66). Compared to ABI 03/05/2018, improved overnight from 0.7, no change in left. Right SFA stenosis no longer present suggests successful revascularization.  Peripheral arteriogram 11/10/2017:  Right prox SFA instent restenosis ( placed 12/17/15 - 6x100 and 6x60 Zilver PTX) S/P Chocolate balloon PTA. Right mid SFA 99% to 0% with LX HawkOne directional atherectomy followed by 6x200 mm ImPact Admiral DCB PTA. Right popliteal widely patent. 3 vessel runoff to the foot.  Renal Ultrasound 12/10/2015: Right angiomyolipoma. Kidneys are otherwise within normal limits for the patient age.  Peripheral Arteriogram 11/16/2015: Right SFA flush occluded with 3 vessel R/O below right knee. Left SFA high grade tandem and diffuse 80% stenosis, 2 vessel R/O below left knee with dominant peroneal and diffusely diseased PT. Occluded AT.     Assessment & Recommendations:   EKG 11/29/2018: Atrial fibrillation with controlled ventricular response at the rate of 84 bpm, normal axis, left bundle branch block.  No further analysis.  1.  Chronic atrial fibrillation with controlled ventricular response, presently on anticoagulation. CHA2DS2-VASc Score is 5.  -(CHF; HTN; vasc disease DM,  Female = 1; Age <65 =0; 65-74 = 1,  >75 =2; stroke = 2).  -(Yearly risk of stroke: Score of 1=1.3; 2=2.2; 3=3.2; 4=4; 5=6.7;  6=9.8; 7=>9.8)  2.  Hypertension, 3.  Hyperthyroidism, presently on methimazole. Has multinodular goitre, FNA biopsy scheduled and follows Dr. Chalmers Cater.  4.  Peripheral arterial disease without symptoms of claudication, abnormal physical exam. 5.  Asymptomatic high-grade right ICA stenosis.   Recent Labs:   04/08/2018: TSH less than 0.006. T4 17.8. T3 256.  03/18/2018: RBC 3.62, hemoglobin 8.4, hematocrit 26.3, microcytic indices, CBC otherwise normal. Creatinine 1.09, EGFR 54, potassium 4.2, CMP normal. TSH less than 0.006.  11/04/2017: INR 1.1, prothrombin time 10.7.  05/19/2017: Cholesterol 156, triglycerides 71, HDL 63, LDL 79. Creatinine 1.12, potassium 5.0, CMP normal. CBC normal.  Recommendation:  Patient was seen by me recently about 4-6 weeks ago with new onset murmur, abdominal discomfort and felt to have hepatomegaly.  Ultrasound of the abdomen  reveals marked hepatomegaly, questionable cirrhosis, suspect this is due to underlying severe tricuspid regurgitation and right-sided heart failure, she now has biventricular failure. However the liver margins are very firm, she is seeing Dr. Jonelle Sidle and she may benefit from MRI of the abdomen to evaluate if there is any malignancy.  I do not know the etiology for cardiomyopathy, however I'll like to start her back on furosemide 20 mg b.i.d. for both leg edema and decompensated systolic heart failure.  I'd like to see her back in 4 weeks for follow-up.  Blood pressure is elevated today, should improve with diuresis.  Multiple new findings including dilated cardiomyopathy with severe LV systolic dysfunction and severe MR along with TR.  Carotid artery duplex is stable.  We'll continue to monitor his however depends on her other medical comorbidities, thyroid biopsies pending as well.   Adrian Prows, MD, [facility] 01/10/2019, 10:41 PM [facility] [place] Pager: [phone] Office: [phone] If no answer Cell [phone]

## 2019-01-10 ENCOUNTER — Encounter: Payer: Self-pay | Admitting: Cardiology

## 2019-01-10 ENCOUNTER — Ambulatory Visit: Payer: Medicare Other | Admitting: Cardiology

## 2019-01-10 ENCOUNTER — Other Ambulatory Visit: Payer: Self-pay | Admitting: Cardiology

## 2019-01-10 ENCOUNTER — Ambulatory Visit (INDEPENDENT_AMBULATORY_CARE_PROVIDER_SITE_OTHER): Payer: Medicare Other | Admitting: Cardiology

## 2019-01-10 ENCOUNTER — Other Ambulatory Visit: Payer: Self-pay

## 2019-01-10 VITALS — BP 150/82 | HR 86 | Ht 64.0 in | Wt 142.4 lb

## 2019-01-10 DIAGNOSIS — I447 Left bundle-branch block, unspecified: Secondary | ICD-10-CM | POA: Diagnosis not present

## 2019-01-10 DIAGNOSIS — R600 Localized edema: Secondary | ICD-10-CM

## 2019-01-10 DIAGNOSIS — I340 Nonrheumatic mitral (valve) insufficiency: Secondary | ICD-10-CM | POA: Diagnosis not present

## 2019-01-10 DIAGNOSIS — I5043 Acute on chronic combined systolic (congestive) and diastolic (congestive) heart failure: Secondary | ICD-10-CM

## 2019-01-10 DIAGNOSIS — I6521 Occlusion and stenosis of right carotid artery: Secondary | ICD-10-CM | POA: Diagnosis not present

## 2019-01-10 DIAGNOSIS — I429 Cardiomyopathy, unspecified: Secondary | ICD-10-CM

## 2019-01-10 DIAGNOSIS — Z7901 Long term (current) use of anticoagulants: Secondary | ICD-10-CM | POA: Insufficient documentation

## 2019-01-10 HISTORY — DX: Left bundle-branch block, unspecified: I44.7

## 2019-01-10 HISTORY — DX: Occlusion and stenosis of right carotid artery: I65.21

## 2019-01-10 HISTORY — DX: Long term (current) use of anticoagulants: Z79.01

## 2019-01-10 HISTORY — DX: Localized edema: R60.0

## 2019-01-10 MED ORDER — POTASSIUM CHLORIDE CRYS ER 10 MEQ PO TBCR: 10.0000 meq | EXTENDED_RELEASE_TABLET | Freq: Two times a day (BID) | ORAL | 3 refills | Status: AC

## 2019-01-10 MED ORDER — POTASSIUM CHLORIDE CRYS ER 10 MEQ PO TBCR: 20.0000 meq | EXTENDED_RELEASE_TABLET | Freq: Two times a day (BID) | ORAL | 3 refills | Status: DC

## 2019-01-10 MED ORDER — FUROSEMIDE 20 MG PO TABS: 20.0000 mg | ORAL_TABLET | Freq: Two times a day (BID) | ORAL | 1 refills | Status: DC

## 2019-02-21 ENCOUNTER — Telehealth: Payer: Self-pay | Admitting: Cardiology

## 2019-02-21 NOTE — Telephone Encounter (Signed)
Discussed with granddaughter Annye Asa and also her daughter Aviva Signs. Patient's cousin had called her yesterday to check on her, there was no answer hence went home and found her cold and she had passed away, EMS was called, I accepted to certify her death as natural death.  She has diagnosis of new onset congestive heart failure and cardiomyopathy, etiology unknown at this time and has underlying peripheral arterial disease, hypothyroidism, multinodular goiter and chronic atrial fibrillation.

## 2019-02-24 ENCOUNTER — Ambulatory Visit: Payer: Medicare Other | Admitting: Cardiology

## 2019-02-25 DEATH — deceased

## 2019-05-23 ENCOUNTER — Other Ambulatory Visit: Payer: Medicare Other

## 2019-05-30 ENCOUNTER — Ambulatory Visit: Payer: Medicare Other | Admitting: Cardiology

## 2019-06-03 ENCOUNTER — Ambulatory Visit: Payer: Medicare Other | Admitting: Cardiology
